# Patient Record
Sex: Male | Born: 1990 | Race: White | Hispanic: No | Marital: Single | Smoking: Never smoker
Health system: Southern US, Community
[De-identification: ages and names within clinical notes are randomized; demographics above are authoritative.]

## PROBLEM LIST (undated history)

## (undated) DIAGNOSIS — I10 Essential (primary) hypertension: Secondary | ICD-10-CM

## (undated) DIAGNOSIS — F319 Bipolar disorder, unspecified: Secondary | ICD-10-CM

## (undated) DIAGNOSIS — E079 Disorder of thyroid, unspecified: Secondary | ICD-10-CM

## (undated) DIAGNOSIS — F909 Attention-deficit hyperactivity disorder, unspecified type: Secondary | ICD-10-CM

## (undated) DIAGNOSIS — F419 Anxiety disorder, unspecified: Secondary | ICD-10-CM

## (undated) HISTORY — DX: Anxiety disorder, unspecified: F41.9

## (undated) HISTORY — PX: FINGER SURGERY: SHX640

---
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
## 2010-05-26 IMAGING — US US EXTREM LOW VENOUS*L*
1 series · 18 of 24 positions shown · non-contrast
Comparison: none

REASON FOR EXAM: CALLREPORT  [phone] weakness swelling calf pain eval DVT
COMMENTS:

PROCEDURE:     US  - US DOPPLER LOW EXTR LEFT  - March 08, 2009 [DATE]
RESULT:     The left femoral and popliteal veins are normally compressible.
The waveform patterns are normal and the color flow images are normal.

[Series 1: us extrem low venous*left* · 18 of 26 slices shown]
[im 1/26]
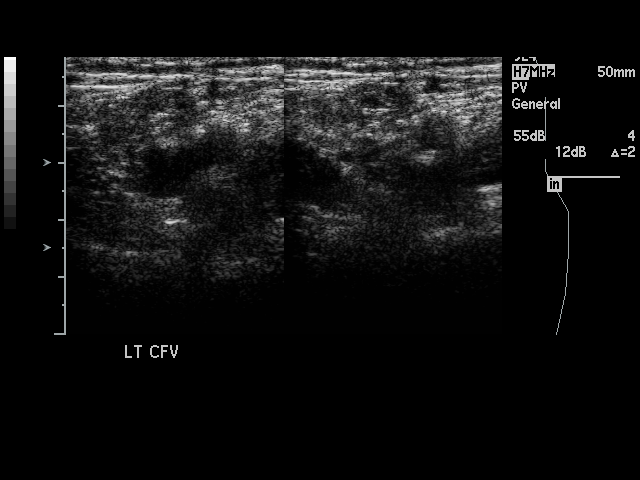
[im 3/26]
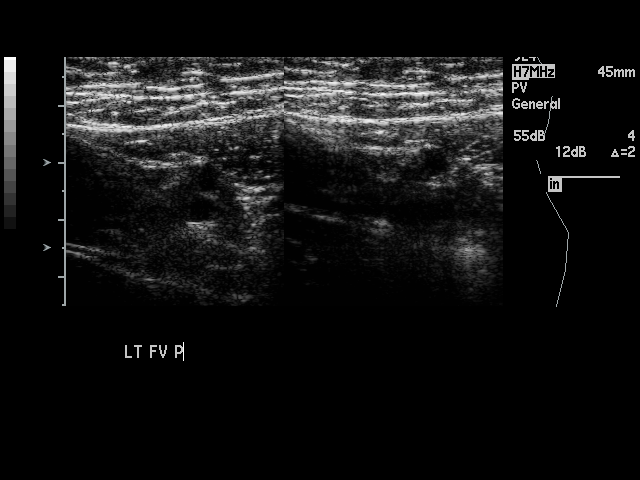
[im 4/26]
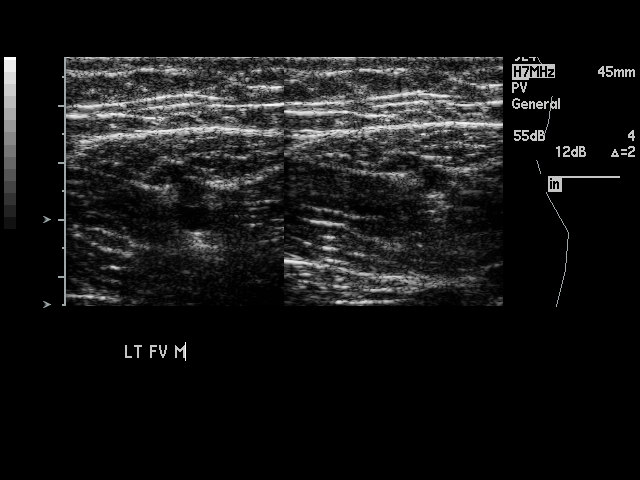
[im 5/26]
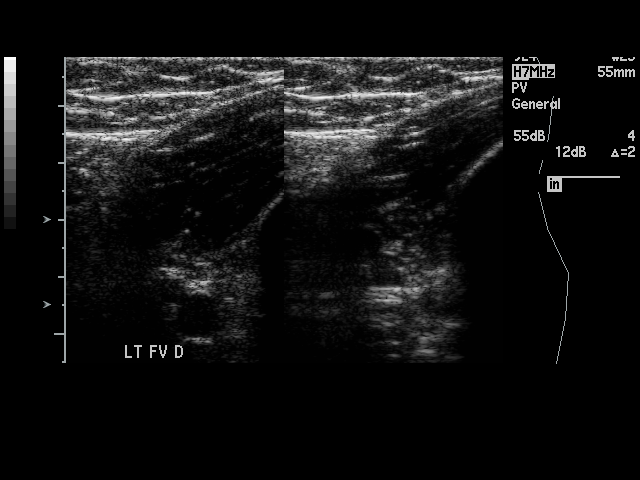
[im 7/26]
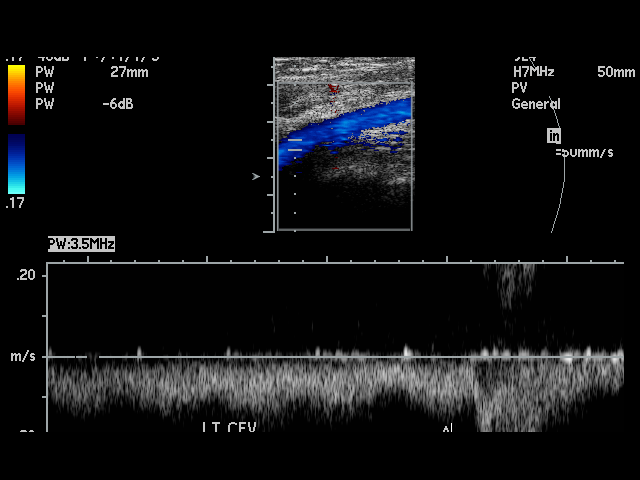
[im 8/26]
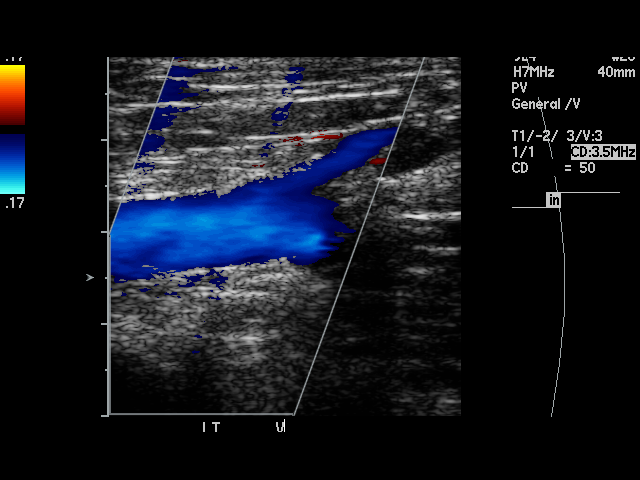
[im 9/26]
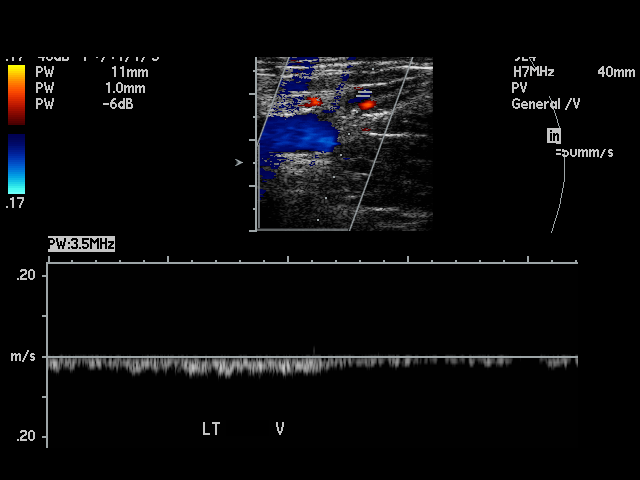
[im 11/26]
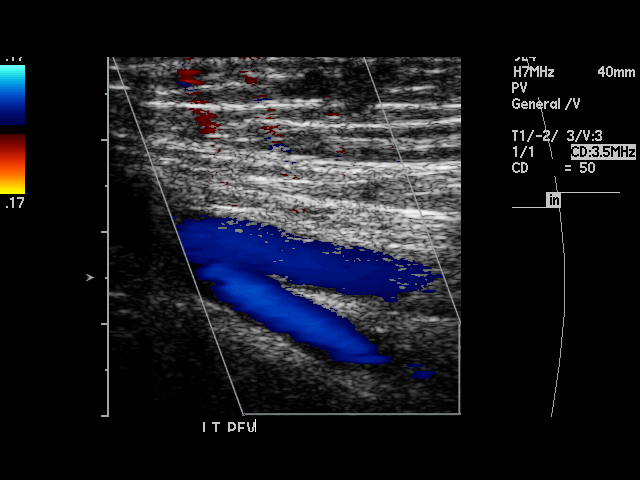
[im 12/26]
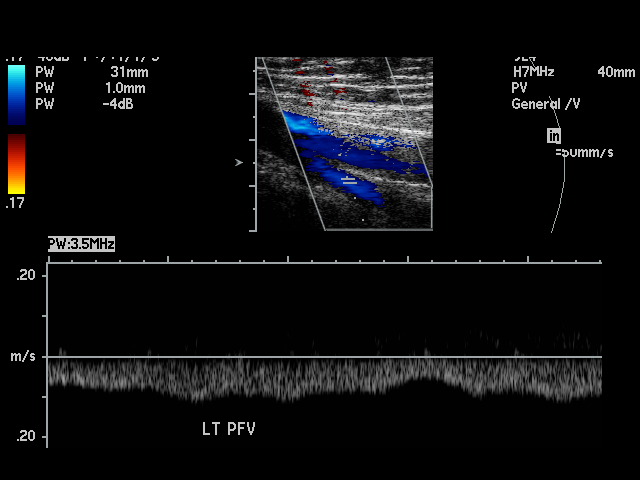
[im 14/26]
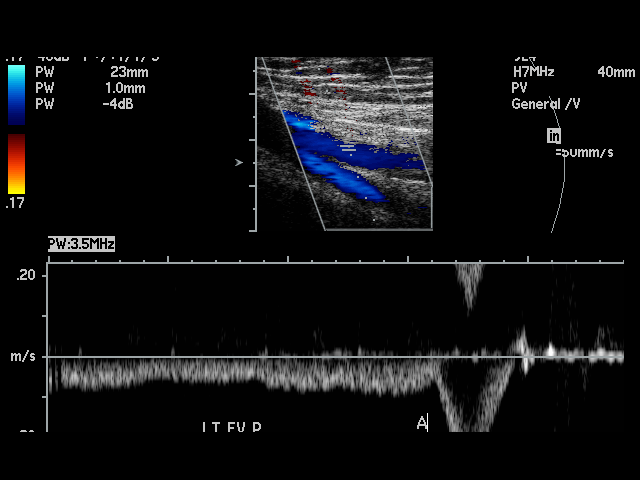
[im 16/26]
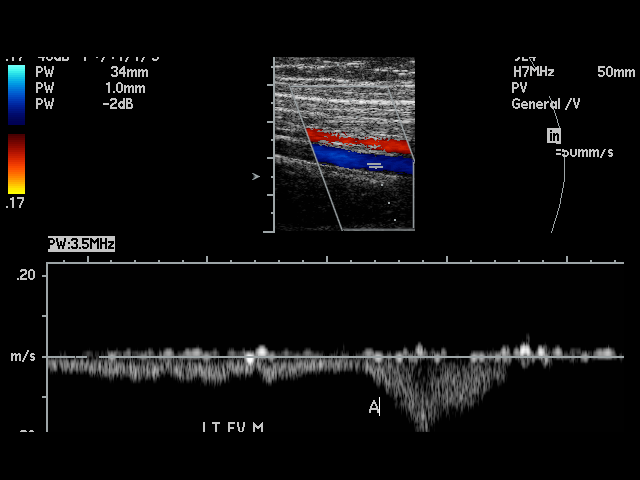
[im 17/26]
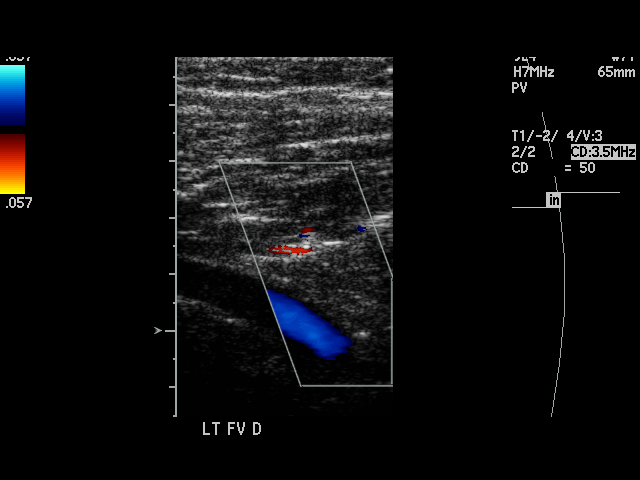
[im 18/26]
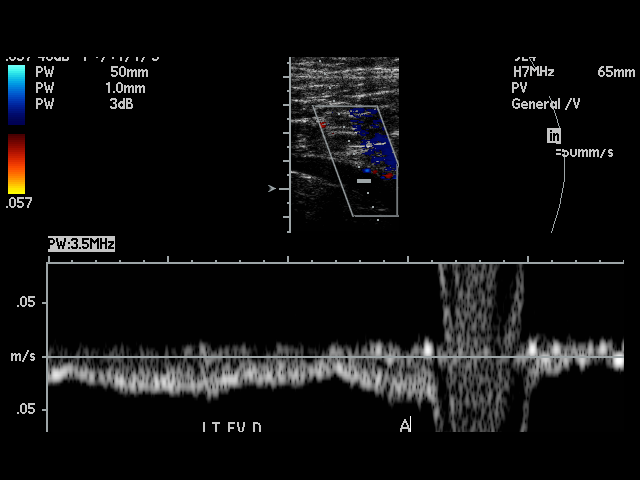
[im 20/26]
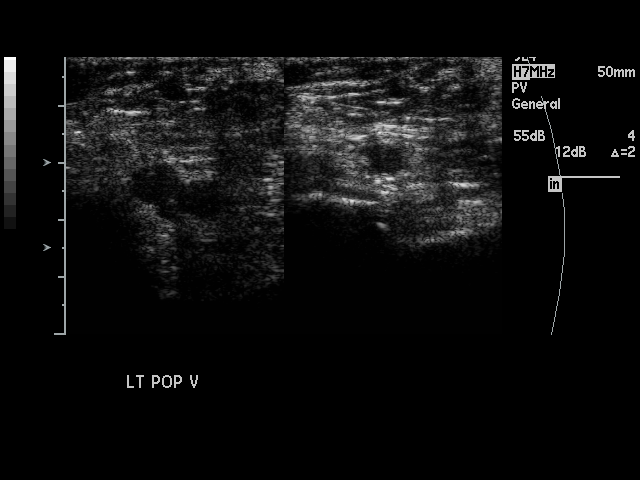
[im 21/26]
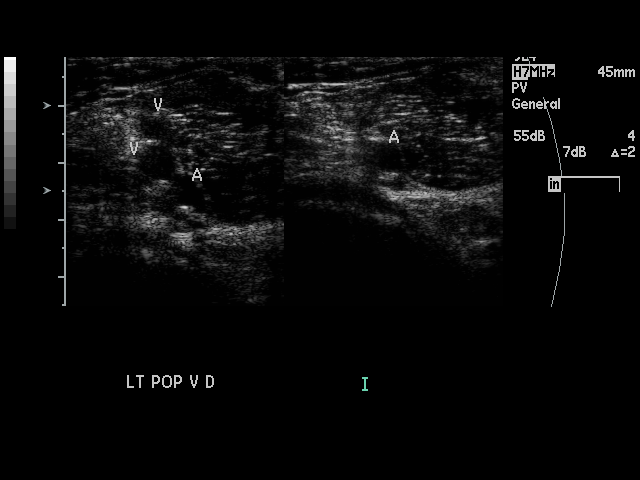
[im 22/26]
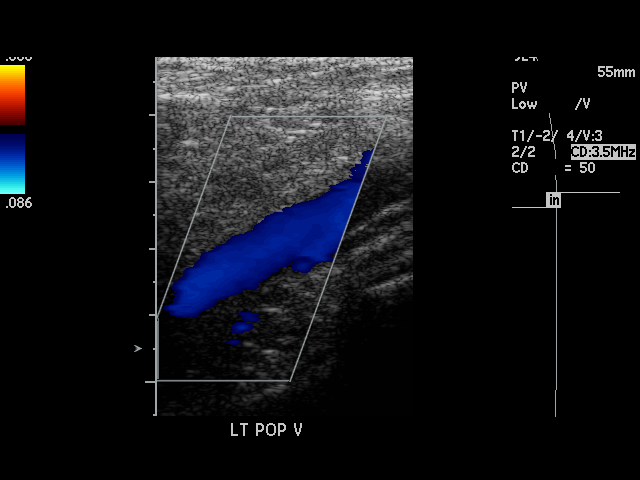
[im 24/26]
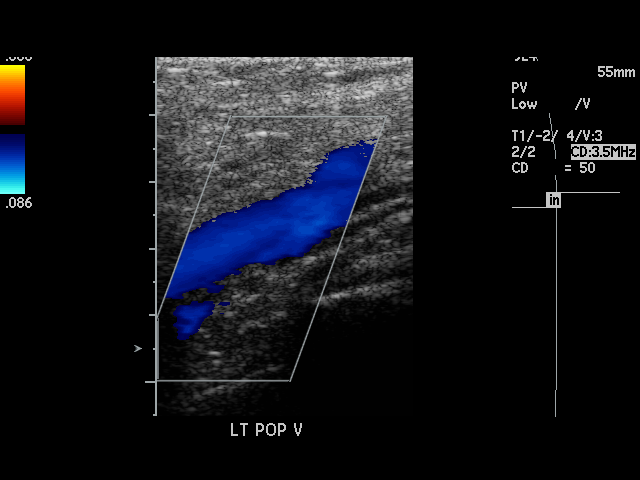
[im 26/26]
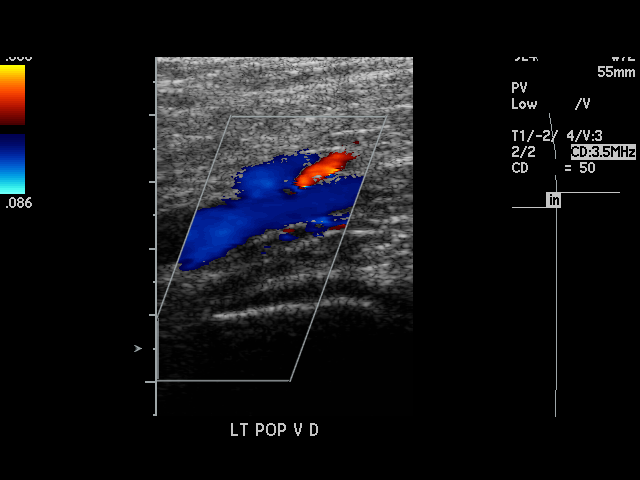

[18 of 24 positions shown; findings below may reference images not displayed]

IMPRESSION: I see no evidence of thrombus within the left femoral or
popliteal veins.

## 2011-11-27 ENCOUNTER — Emergency Department: Payer: Self-pay | Admitting: Unknown Physician Specialty

## 2012-05-05 ENCOUNTER — Emergency Department: Payer: Self-pay | Admitting: Emergency Medicine

## 2013-09-03 ENCOUNTER — Emergency Department: Payer: Self-pay | Admitting: Emergency Medicine

## 2013-09-03 LAB — URINALYSIS, COMPLETE
BACTERIA: NONE SEEN
Bilirubin,UR: NEGATIVE
Blood: NEGATIVE
Glucose,UR: NEGATIVE mg/dL (ref 0–75)
Ketone: NEGATIVE
Leukocyte Esterase: NEGATIVE
Nitrite: NEGATIVE
PH: 6 (ref 4.5–8.0)
Protein: NEGATIVE
RBC,UR: 1 /HPF (ref 0–5)
SPECIFIC GRAVITY: 1.025 (ref 1.003–1.030)
SQUAMOUS EPITHELIAL: NONE SEEN

## 2013-09-03 LAB — COMPREHENSIVE METABOLIC PANEL
ALK PHOS: 84 U/L
Albumin: 4.4 g/dL (ref 3.4–5.0)
Anion Gap: 0 — ABNORMAL LOW (ref 7–16)
BUN: 18 mg/dL (ref 7–18)
Bilirubin,Total: 1.1 mg/dL — ABNORMAL HIGH (ref 0.2–1.0)
CALCIUM: 9.1 mg/dL (ref 8.5–10.1)
CHLORIDE: 103 mmol/L (ref 98–107)
Co2: 31 mmol/L (ref 21–32)
Creatinine: 0.94 mg/dL (ref 0.60–1.30)
EGFR (African American): >60
EGFR (Non-African Amer.): >60
GLUCOSE: 107 mg/dL — AB (ref 65–99)
OSMOLALITY: 271 (ref 275–301)
Potassium: 4.1 mmol/L (ref 3.5–5.1)
SGOT(AST): 21 U/L (ref 15–37)
SGPT (ALT): 19 U/L (ref 12–78)
SODIUM: 134 mmol/L — AB (ref 136–145)
TOTAL PROTEIN: 7.3 g/dL (ref 6.4–8.2)

## 2013-09-03 LAB — CBC WITH DIFFERENTIAL/PLATELET
BASOS ABS: 0 10*3/uL (ref 0.0–0.1)
Basophil %: 0.3 %
Eosinophil #: 0 10*3/uL (ref 0.0–0.7)
Eosinophil %: 0.2 %
HCT: 44.3 % (ref 40.0–52.0)
HGB: 15.1 g/dL (ref 13.0–18.0)
LYMPHS PCT: 4.3 %
Lymphocyte #: 0.3 10*3/uL — ABNORMAL LOW (ref 1.0–3.6)
MCH: 30.2 pg (ref 26.0–34.0)
MCHC: 34.1 g/dL (ref 32.0–36.0)
MCV: 89 fL (ref 80–100)
MONO ABS: 0.5 x10 3/mm (ref 0.2–1.0)
Monocyte %: 7 %
NEUTROS PCT: 88.2 %
Neutrophil #: 6.4 10*3/uL (ref 1.4–6.5)
Platelet: 169 10*3/uL (ref 150–440)
RBC: 5 10*6/uL (ref 4.40–5.90)
RDW: 13.5 % (ref 11.5–14.5)
WBC: 7.2 10*3/uL (ref 3.8–10.6)

## 2013-10-01 LAB — CBC
HCT: 45.5 % (ref 40.0–52.0)
HGB: 15.2 g/dL (ref 13.0–18.0)
MCH: 29.2 pg (ref 26.0–34.0)
MCHC: 33.3 g/dL (ref 32.0–36.0)
MCV: 88 fL (ref 80–100)
PLATELETS: 211 10*3/uL (ref 150–440)
RBC: 5.2 10*6/uL (ref 4.40–5.90)
RDW: 13.1 % (ref 11.5–14.5)
WBC: 4.7 10*3/uL (ref 3.8–10.6)

## 2013-10-01 LAB — DRUG SCREEN, URINE
Amphetamines, Ur Screen: NEGATIVE (ref ?–1000)
BENZODIAZEPINE, UR SCRN: NEGATIVE (ref ?–200)
Barbiturates, Ur Screen: NEGATIVE (ref ?–200)
COCAINE METABOLITE, UR ~~LOC~~: NEGATIVE (ref ?–300)
Cannabinoid 50 Ng, Ur ~~LOC~~: NEGATIVE (ref ?–50)
MDMA (ECSTASY) UR SCREEN: NEGATIVE (ref ?–500)
Methadone, Ur Screen: NEGATIVE (ref ?–300)
Opiate, Ur Screen: NEGATIVE (ref ?–300)
Phencyclidine (PCP) Ur S: NEGATIVE (ref ?–25)
TRICYCLIC, UR SCREEN: NEGATIVE (ref ?–1000)

## 2013-10-01 LAB — COMPREHENSIVE METABOLIC PANEL
ALK PHOS: 100 U/L
ALT: 27 U/L (ref 12–78)
AST: 27 U/L (ref 15–37)
Albumin: 4.5 g/dL (ref 3.4–5.0)
Anion Gap: 3 — ABNORMAL LOW (ref 7–16)
BUN: 9 mg/dL (ref 7–18)
Bilirubin,Total: 0.3 mg/dL (ref 0.2–1.0)
CALCIUM: 9.1 mg/dL (ref 8.5–10.1)
CO2: 29 mmol/L (ref 21–32)
Chloride: 107 mmol/L (ref 98–107)
Creatinine: 0.72 mg/dL (ref 0.60–1.30)
EGFR (African American): >60
Glucose: 128 mg/dL — ABNORMAL HIGH (ref 65–99)
Osmolality: 278 (ref 275–301)
POTASSIUM: 3.7 mmol/L (ref 3.5–5.1)
Sodium: 139 mmol/L (ref 136–145)
Total Protein: 7.7 g/dL (ref 6.4–8.2)

## 2013-10-01 LAB — URINALYSIS, COMPLETE
BACTERIA: NONE SEEN
BLOOD: NEGATIVE
Bilirubin,UR: NEGATIVE
Glucose,UR: NEGATIVE mg/dL (ref 0–75)
KETONE: NEGATIVE
Leukocyte Esterase: NEGATIVE
Nitrite: NEGATIVE
PROTEIN: NEGATIVE
Ph: 6 (ref 4.5–8.0)
RBC,UR: NONE SEEN /HPF (ref 0–5)
SPECIFIC GRAVITY: 1.015 (ref 1.003–1.030)
Squamous Epithelial: NONE SEEN
WBC UR: NONE SEEN /HPF (ref 0–5)

## 2013-10-01 LAB — ETHANOL
Ethanol %: <0.003 % (ref 0.000–0.080)
Ethanol: <3 mg/dL

## 2013-10-01 LAB — ACETAMINOPHEN LEVEL: Acetaminophen: <2 ug/mL

## 2013-10-01 LAB — SALICYLATE LEVEL

## 2013-10-02 ENCOUNTER — Inpatient Hospital Stay: Payer: Self-pay | Admitting: Psychiatry

## 2013-10-05 LAB — LITHIUM LEVEL: Lithium: 1.17 mmol/L

## 2013-10-20 ENCOUNTER — Emergency Department: Payer: Self-pay | Admitting: Emergency Medicine

## 2014-02-04 LAB — ACETAMINOPHEN LEVEL: Acetaminophen: <2 ug/mL

## 2014-02-04 LAB — COMPREHENSIVE METABOLIC PANEL
ALBUMIN: 4.2 g/dL (ref 3.4–5.0)
ALK PHOS: 76 U/L
ALT: 15 U/L
ANION GAP: 8 (ref 7–16)
AST: 23 U/L (ref 15–37)
BILIRUBIN TOTAL: 0.4 mg/dL (ref 0.2–1.0)
BUN: 9 mg/dL (ref 7–18)
CALCIUM: 8.9 mg/dL (ref 8.5–10.1)
CO2: 27 mmol/L (ref 21–32)
Chloride: 106 mmol/L (ref 98–107)
Creatinine: 0.85 mg/dL (ref 0.60–1.30)
EGFR (African American): >60
EGFR (Non-African Amer.): >60
Glucose: 95 mg/dL (ref 65–99)
Osmolality: 280 (ref 275–301)
POTASSIUM: 4 mmol/L (ref 3.5–5.1)
Sodium: 141 mmol/L (ref 136–145)
Total Protein: 7.1 g/dL (ref 6.4–8.2)

## 2014-02-04 LAB — CBC
HCT: 46.3 % (ref 40.0–52.0)
HGB: 15.8 g/dL (ref 13.0–18.0)
MCH: 29.7 pg (ref 26.0–34.0)
MCHC: 34 g/dL (ref 32.0–36.0)
MCV: 87 fL (ref 80–100)
Platelet: 202 10*3/uL (ref 150–440)
RBC: 5.32 10*6/uL (ref 4.40–5.90)
RDW: 12.7 % (ref 11.5–14.5)
WBC: 6 10*3/uL (ref 3.8–10.6)

## 2014-02-04 LAB — SALICYLATE LEVEL: Salicylates, Serum: <1.7 mg/dL

## 2014-02-04 LAB — ETHANOL: Ethanol %: <0.003 % (ref 0.000–0.080)

## 2014-02-05 ENCOUNTER — Inpatient Hospital Stay: Payer: Self-pay | Admitting: Psychiatry

## 2014-02-05 LAB — DRUG SCREEN, URINE

## 2014-02-05 LAB — URINALYSIS, COMPLETE
Bacteria: NONE SEEN
Bilirubin,UR: NEGATIVE
Blood: NEGATIVE
Glucose,UR: NEGATIVE mg/dL (ref 0–75)
Ketone: NEGATIVE
LEUKOCYTE ESTERASE: NEGATIVE
NITRITE: NEGATIVE
PROTEIN: NEGATIVE
Ph: 7 (ref 4.5–8.0)
RBC,UR: <1 /HPF (ref 0–5)
SPECIFIC GRAVITY: 1.009 (ref 1.003–1.030)
Squamous Epithelial: <1
WBC UR: 1 /HPF (ref 0–5)

## 2014-02-05 LAB — LITHIUM LEVEL: Lithium: 0.58 mmol/L — ABNORMAL LOW
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
## 2014-10-09 NOTE — Discharge Summary (Signed)
PATIENT NAME:  Samuel Zavala MR#:  [id number] DATE OF BIRTH:  Sep 13, 1990  DATE OF ADMISSION:  10/02/2013 DATE OF DISCHARGE:  10/06/2013  HOSPITAL COURSE: See dictated history and physical for details of admission. This 24 year old man with a history of bipolar disorder came to the hospital seeking treatment because he said that he had been feeling worse. He had been having suicidal thoughts. His thoughts had felt like they were racing and his mood had been depressed and anxious. He had suffered loss as a grandmother he was close to had recently died. He also had chronic stress from currently living on his own in a boarding house. The patient was jittery and anxious. He was cooperative with admission to the hospital and treatment. In the hospital, we initially had some difference of understanding about what his recent medication had been. The patient had told me that he had been prescribed Abilify, but the Emergency Room psychiatry workers contacted his providers and were clearly informed that his most recent medicine had been Seroquel. Based on this, I had continued him on Seroquel. The patient still insists that he had been on Abilify recently, but after a day of experiencing the Seroquel said that he was willing to try a change to it. We have now titrated the Seroquel up to 400 mg a day and the patient says that he finds this to be helping him to calm down and sleep better as well. He is not currently reporting any hallucinations, does not appear to be paranoid and does not report anything that sounds delusional. He had a lithium level that was slightly elevated at 1.17. Although he was not having specific lithium side effects, I suggested that we cut his dose down slightly,  just to decrease the risk of toxicity and he was agreeable to that. Lithium has now been decreased to 600 mg twice a day. Otherwise, he has continued his prior medications including his levothyroxine and his Cymbalta. He has  participated in groups very appropriately. Not engaged in any dangerous behavior. I did make a trial of treating him with Adderall during this hospitalization. His presentation to me suggested the possibility that attention deficit hyperactivity disorder could be the correct diagnosis, particularly since he told me he had been diagnosed with that as a child. I tried dosing him with a fairly low dose of Adderall 10 mg plain dosage once in the morning and once at noon. He found this to be terribly over stimulating. It made him very jittery, his speech was rapid, his thoughts were worse. We decrease it the next day to 5 mg twice a day instead and he still found this to be over stimulating. He did not become dangerous or psychotic, but we did stop the Adderall. I have counseled him that in the future if the idea of stimulants comes up he should tell any providers that he has had this experience, so it might not be a good idea to try them again. Currently, he is feeling good. He does not appear to be manic. Appears to be safe and agrees to outpatient treatment going back to [place].   DISCHARGE MEDICATIONS: Seroquel XR 400 mg p.o. at bedtime, levothyroxine 25 mcg p.o. daily, Cymbalta 60 mg p.o. daily and lithium 600 mg twice a day.   MENTAL STATUS AT DISCHARGE: Casually dressed, neatly groomed man who looks his stated age, cooperative with the interview. Good eye contact. Normal psychomotor activity. Speech normal rate, tone and volume. Affect euthymic, reactive, appropriate.  Mood stated as good. Thoughts appear to be lucid. No sign of delusions. No loosening of associations. Denies auditory or visual hallucinations. Denies suicidal or homicidal ideation. Alert and oriented x 4. Normal fund of knowledge. Short and long-term memory intact.   LABORATORY TESTING: Admission labs included a negative drug screen. A chemistry panel with an elevated glucose at 128 that was a non-fasting draw. Alcohol level negative. CBC  normal. Urinalysis unremarkable. The lithium level was checked on the 20th and was 1.17. I did not recheck after lowering the dose.   DISPOSITION: Discharge home. He lives in a boarding house.   FOLLOWUP With [facility] [place] and Dr. Elesa Massed.   DIAGNOSIS, PRINCIPAL AND PRIMARY:  AXIS I: Bipolar disorder, type I mixed episode.  SECONDARY DIAGNOSES:  AXIS I: Attention deficit/hyperactivity disorder by past history.  AXIS II: Deferred.  AXIS III: Hypothyroidism.  AXIS IV: Moderate from recent death in the family and chronic isolation.  AXIS V: Functioning at time of discharge 60.  ____________________________ Audery Amel, MD jtc:aw D: 10/06/2013 09:48:00 ET T: 10/06/2013 10:01:04 ET JOB#: [id number]  cc: Audery Amel, MD, <Dictator> Audery Amel MD ELECTRONICALLY SIGNED 10/06/2013 11:43

## 2015-01-07 IMAGING — CR DG ANKLE COMPLETE 3+V*L*
1 series · 3 of 3 positions shown · non-contrast
Comparison: None.

CLINICAL DATA: Jumped from tree, lateral ankle swelling.

EXAM:
LEFT ANKLE COMPLETE - 3+ VIEW

[Series 1: ap · 0.17mm/px · 3 of 3 slices shown]
[im 1/3]
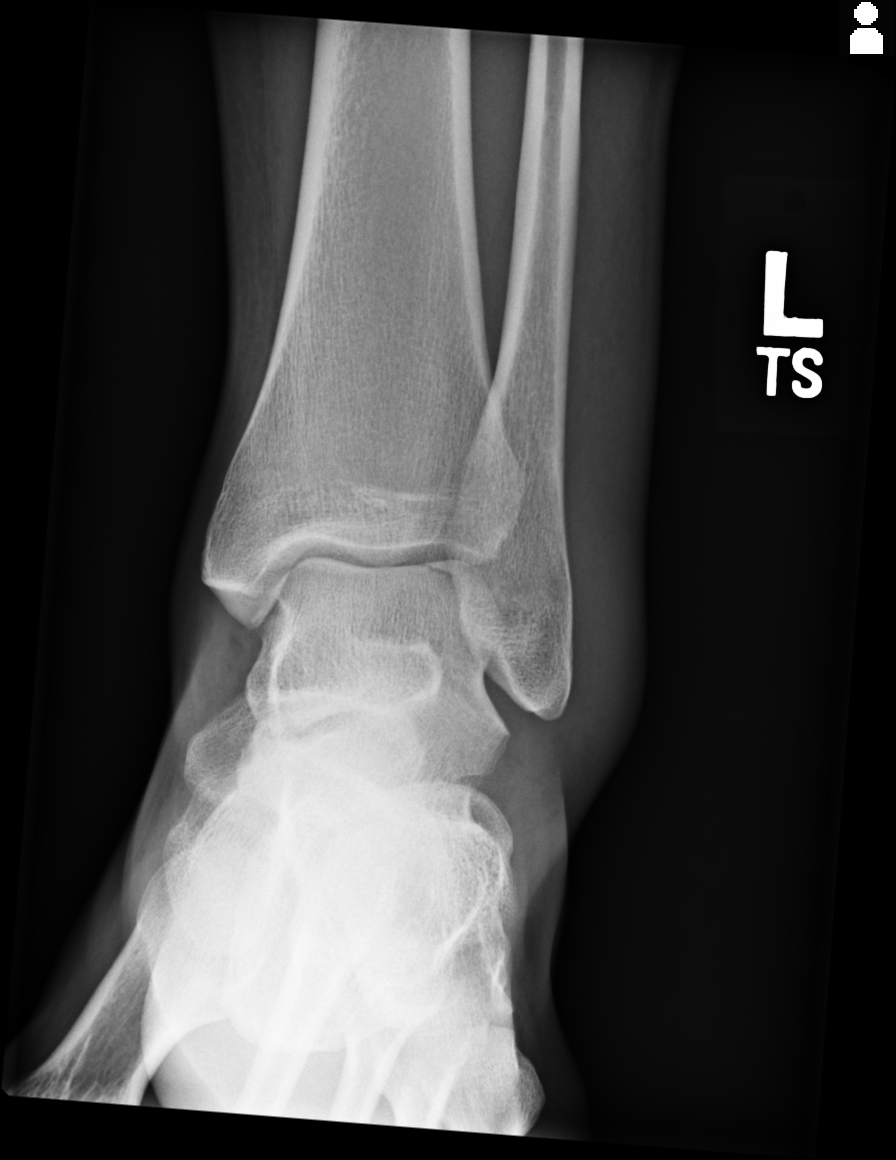
[im 2/3]
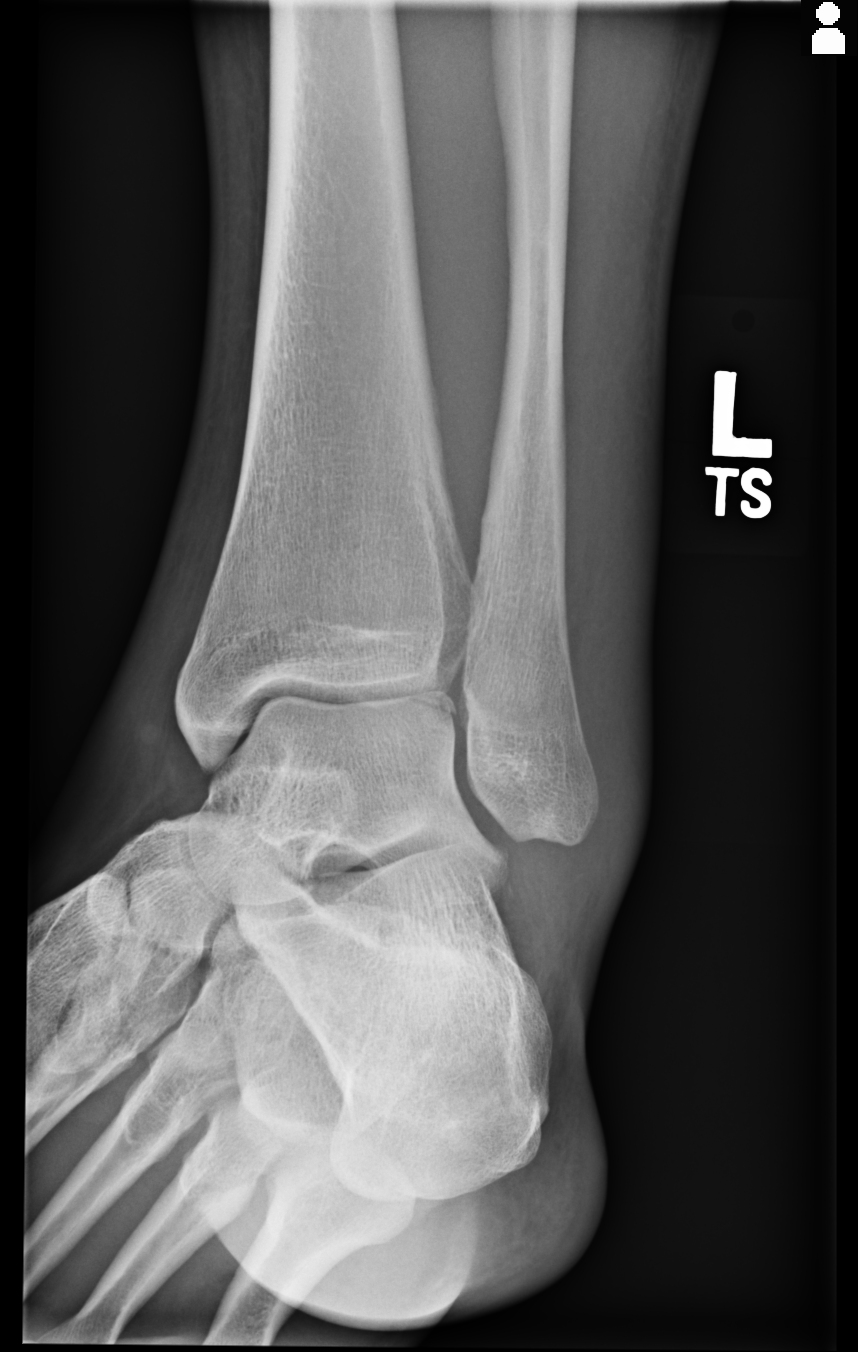
[im 3/3]
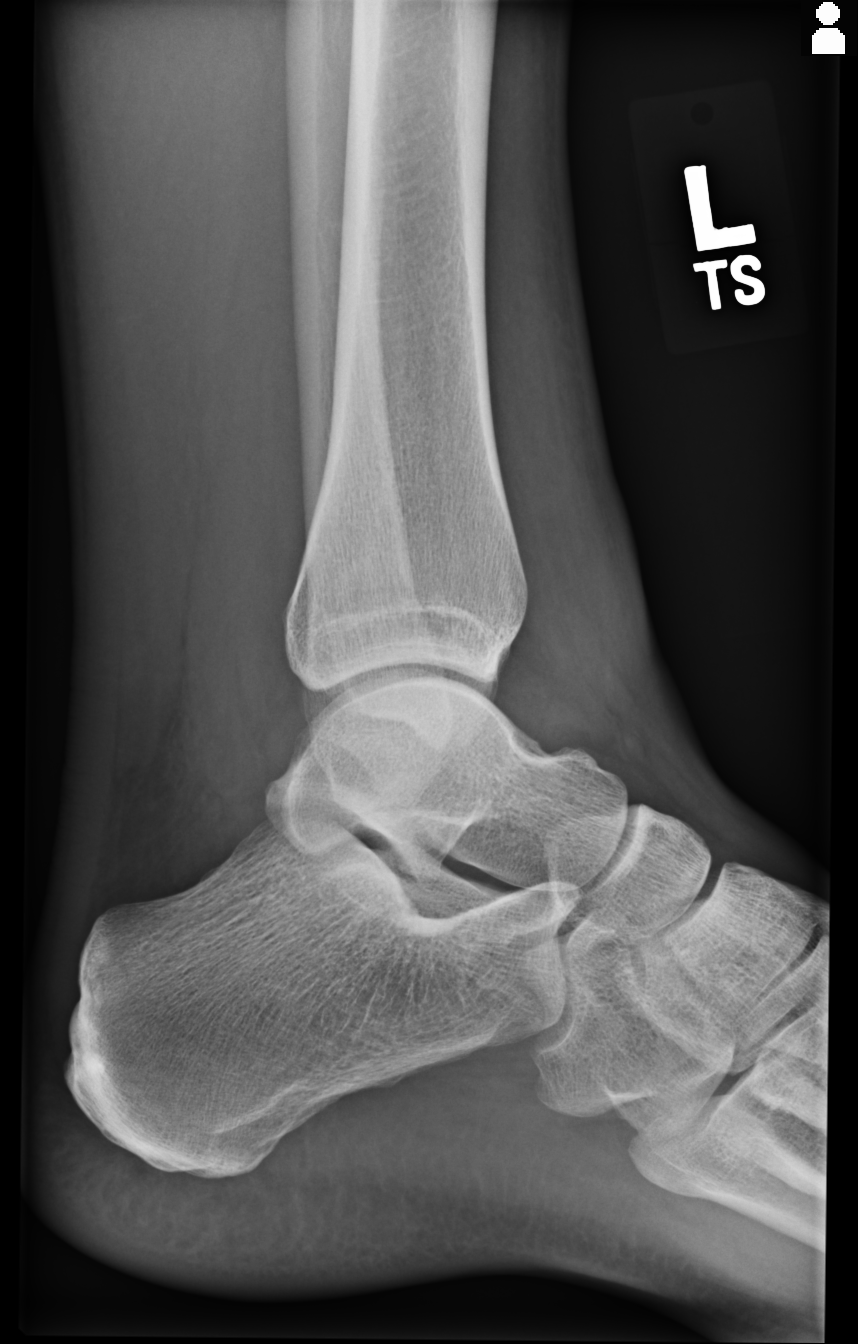

[3 of 3 positions shown; findings below may reference images not displayed]

FINDINGS: Nondisplaced intra-articular lateral talar dome fracture, mid seen
on the oblique view. No dislocation. The ankle mortise appears
congruent and the tibiofibular syndesmosis intact. No destructive
bony lesions. Lateral ankle soft tissue swelling without
subcutaneous gas nor radiopaque foreign bodies.
IMPRESSION: Nondisplaced intra-articular lateral talar dome fracture without
dislocation.

  By: Olimpia Tiger

## 2015-08-10 ENCOUNTER — Ambulatory Visit
Admission: EM | Admit: 2015-08-10 | Discharge: 2015-08-10 | Disposition: A | Discharge status: Home / Self Care | Payer: Self-pay | Attending: Family Medicine | Admitting: Family Medicine

## 2015-08-10 ENCOUNTER — Encounter: Payer: Self-pay | Admitting: Emergency Medicine

## 2015-08-10 DIAGNOSIS — G44209 Tension-type headache, unspecified, not intractable: Secondary | ICD-10-CM

## 2015-08-10 HISTORY — DX: Bipolar disorder, unspecified: F31.9

## 2015-08-10 HISTORY — DX: Attention-deficit hyperactivity disorder, unspecified type: F90.9

## 2015-08-10 MED ORDER — ONDANSETRON 8 MG PO TBDP: 8.0000 mg | ORAL_TABLET | Freq: Two times a day (BID) | ORAL | Status: DC

## 2015-08-10 NOTE — ED Provider Notes (Signed)
CSN: [id number]     Arrival date & time 08/10/15  2007 History   First MD Initiated Contact with Patient 08/10/15 2106     Chief Complaint  Patient presents with  . Headache   (Consider location/radiation/quality/duration/timing/severity/associated sxs/prior Treatment) Patient is a 25 y.o. male presenting with headaches. The history is provided by the patient.  Headache Pain location:  Generalized Quality:  Dull Radiates to:  Does not radiate Severity currently:  2/10 Onset quality:  Sudden Duration:  3 hours Timing:  Constant Progression:  Improving Chronicity:  New Similar to prior headaches: yes   Context: emotional stress   Relieved by:  None tried Ineffective treatments:  None tried Associated symptoms: nausea   Associated symptoms: no abdominal pain, no back pain, no blurred vision, no congestion, no cough, no diarrhea, no dizziness, no drainage, no ear pain, no eye pain, no facial pain, no fatigue, no fever, no focal weakness, no loss of balance, no myalgias, no near-syncope, no neck pain, no neck stiffness, no numbness, no paresthesias, no photophobia, no seizures, no sinus pressure, no sore throat, no swollen glands, no syncope, no tingling, no URI, no visual change, no vomiting and no weakness     Past Medical History  Diagnosis Date  . Bipolar 1 disorder (HCC)   . ADHD (attention deficit hyperactivity disorder)    History reviewed. No pertinent past surgical history. History reviewed. No pertinent family history. Social History  Substance Use Topics  . Smoking status: Never Smoker   . Smokeless tobacco: Never Used  . Alcohol Use: No    Review of Systems  Constitutional: Negative for fever and fatigue.  HENT: Negative for congestion, ear pain, postnasal drip, sinus pressure and sore throat.   Eyes: Negative for blurred vision, photophobia and pain.  Respiratory: Negative for cough.   Cardiovascular: Negative for syncope and near-syncope.  Gastrointestinal:  Positive for nausea. Negative for vomiting, abdominal pain and diarrhea.  Musculoskeletal: Negative for myalgias, back pain, neck pain and neck stiffness.  Neurological: Positive for headaches. Negative for dizziness, focal weakness, seizures, weakness, numbness, paresthesias and loss of balance.    Allergies  Review of patient's allergies indicates no known allergies.  Home Medications   Prior to Admission medications   Medication Sig Start Date End Date Taking? Authorizing Provider  levothyroxine (SYNTHROID, LEVOTHROID) 25 MCG tablet Take 25 mcg by mouth daily before breakfast.   Yes Historical Provider, MD  mirtazapine (REMERON) 45 MG tablet Take 45 mg by mouth at bedtime.   Yes Historical Provider, MD  risperiDONE (RISPERDAL) 1 MG tablet Take 1 mg by mouth at bedtime.   Yes Historical Provider, MD  ondansetron (ZOFRAN ODT) 8 MG disintegrating tablet Take 1 tablet (8 mg total) by mouth 2 (two) times daily. As needed for nausea 08/10/15   Payton Mccallum, MD   Meds Ordered and Administered this Visit  Medications - No data to display  BP 123/83 mmHg  Pulse 76  Temp(Src) 97.7 F (36.5 C) (Tympanic)  Resp 16  Ht  (1.803 m)  Wt 175 lb (79.379 kg)  BMI 24.42 kg/m2  SpO2 99% No data found.   Physical Exam  Constitutional: He is oriented to person, place, and time. He appears well-developed and well-nourished. No distress.  HENT:  Head: Normocephalic and atraumatic.  Right Ear: Tympanic membrane, external ear and ear canal normal.  Left Ear: Tympanic membrane, external ear and ear canal normal.  Nose: Nose normal.  Mouth/Throat: Uvula is midline, oropharynx is clear and  moist and mucous membranes are normal. No oropharyngeal exudate or tonsillar abscesses.  Eyes: Conjunctivae and EOM are normal. Pupils are equal, round, and reactive to light. Right eye exhibits no discharge. Left eye exhibits no discharge. No scleral icterus.  Neck: Normal range of motion. Neck supple. No  tracheal deviation present. No thyromegaly present.  Cardiovascular: Normal rate, regular rhythm and normal heart sounds.   Pulmonary/Chest: Effort normal and breath sounds normal. No stridor. No respiratory distress. He has no wheezes. He has no rales. He exhibits no tenderness.  Lymphadenopathy:    He has no cervical adenopathy.  Neurological: He is alert and oriented to person, place, and time. He has normal reflexes. He displays normal reflexes. No cranial nerve deficit. He exhibits normal muscle tone. Coordination normal.  Skin: Skin is warm and dry. No rash noted. He is not diaphoretic.  Nursing note and vitals reviewed.   ED Course  Procedures (including critical care time)  Labs Review Labs Reviewed - No data to display  Imaging Review No results found.   Visual Acuity Review  Right Eye Distance:   Left Eye Distance:   Bilateral Distance:    Right Eye Near:   Left Eye Near:    Bilateral Near:         MDM   1. Tension headache    Discharge Medication List as of 08/10/2015  9:26 PM    START taking these medications   Details  ondansetron (ZOFRAN ODT) 8 MG disintegrating tablet Take 1 tablet (8 mg total) by mouth 2 (two) times daily. As needed for nausea, Starting 08/10/2015, Until Discontinued, Print       1. diagnosis reviewed with patient 2. rx as per orders above; reviewed possible side effects, interactions, risks and benefits  3. Recommend supportive treatment with otc analgesics 4. Follow-up prn if symptoms worsen or don't improve    Payton Mccallum, MD 08/10/15 2152

## 2015-08-10 NOTE — ED Notes (Signed)
Patient reports nausea and headache that started around 6:30pm today.  Patient denies fevers.  Patient denies V/D.

## 2016-05-06 ENCOUNTER — Emergency Department
Admission: EM | Admit: 2016-05-06 | Discharge: 2016-05-06 | Disposition: A | Discharge status: Home / Self Care | Payer: Self-pay | Attending: Emergency Medicine | Admitting: Emergency Medicine

## 2016-05-06 DIAGNOSIS — K047 Periapical abscess without sinus: Secondary | ICD-10-CM | POA: Insufficient documentation

## 2016-05-06 DIAGNOSIS — F909 Attention-deficit hyperactivity disorder, unspecified type: Secondary | ICD-10-CM | POA: Insufficient documentation

## 2016-05-06 DIAGNOSIS — Z79899 Other long term (current) drug therapy: Secondary | ICD-10-CM | POA: Insufficient documentation

## 2016-05-06 MED ORDER — AMOXICILLIN 500 MG PO CAPS
ORAL_CAPSULE | ORAL | Status: AC
  Administered 2016-05-06: 500 mg via ORAL
  Filled 2016-05-06: qty 1

## 2016-05-06 MED ORDER — AMOXICILLIN 875 MG PO TABS: 875.0000 mg | ORAL_TABLET | Freq: Two times a day (BID) | ORAL | 0 refills | Status: AC

## 2016-05-06 MED ORDER — MELOXICAM 7.5 MG PO TABS
ORAL_TABLET | ORAL | Status: AC
  Administered 2016-05-06: 7.5 mg via ORAL
  Filled 2016-05-06: qty 1

## 2016-05-06 MED ORDER — MELOXICAM 7.5 MG PO TABS
7.5000 mg | ORAL_TABLET | ORAL | Status: AC
  Administered 2016-05-06: 7.5 mg via ORAL

## 2016-05-06 MED ORDER — MELOXICAM 7.5 MG PO TABS: 7.5000 mg | ORAL_TABLET | Freq: Every day | ORAL | 2 refills | Status: AC

## 2016-05-06 MED ORDER — AMOXICILLIN 250 MG PO CAPS
ORAL_CAPSULE | ORAL | Status: AC
  Administered 2016-05-06: 250 mg via ORAL
  Filled 2016-05-06: qty 1

## 2016-05-06 MED ORDER — AMOXICILLIN 500 MG PO CAPS
500.0000 mg | ORAL_CAPSULE | ORAL | Status: AC
  Administered 2016-05-06: 500 mg via ORAL

## 2016-05-06 MED ORDER — AMOXICILLIN 250 MG PO CAPS
250.0000 mg | ORAL_CAPSULE | Freq: Once | ORAL | Status: AC
  Administered 2016-05-06: 250 mg via ORAL

## 2016-05-06 NOTE — ED Notes (Signed)
NAD noted at time of D/C. Pt denies questions or concerns. Pt ambulatory to the lobby at this time.  

## 2016-05-06 NOTE — Discharge Instructions (Signed)
Please call and schedule a dental appointment as soon as possible. You will need to be seen within the next 14 days. Return to the emergency department for symptoms that change or worsen if you're unable to schedule an appointment.  OPTIONS FOR DENTAL FOLLOW UP CARE  [place] Department of Health and Human Services - Local Safety Net Dental Clinics http://www.ncdhhs.gov/dph/oralhealth/services/safetynetclinics.htm   [facility] ([phone])  [facility] ([phone])  [facility] ([phone])  [facility] ([phone])  [facility] ([phone]) This clinic caters to the indigent population and is on a lottery system. Location: [facility], [address], [place] Clinic Hours: Wednesdays from 6pm - 9pm, patients seen by a lottery system. For dates, call or go to www.med.[facility].edu/[facility]/patients/Dental-[facility] Services: Cleanings, fillings and simple extractions. Payment Options: DENTAL WORK IS FREE OF CHARGE. Bring proof of income or support. Best way to get seen: Arrive at 5:15 pm - this is a lottery, NOT first come/first serve, so arriving earlier will not increase your chances of being seen.     [facility] [phone] Select option 1 for emergencies   Location: [facility], [address], [place] Clinic Hours: No walk-ins accepted - call the day before to schedule an appointment. Check in times are 9:30 am and 1:30 pm. Services: Simple extractions, temporary fillings, pulpectomy/pulp debridement, uncomplicated abscess drainage. Payment Options: PAYMENT IS DUE AT THE TIME OF SERVICE.  Fee is usually $100-200, additional surgical procedures (e.g. abscess drainage) may be extra. Cash, checks, Visa/MasterCard accepted.  Can file Medicaid if patient is covered for dental - patient should call case worker to check. No  discount for [facility] patients. Best way to get seen: MUST call the day before and get onto the schedule. Can usually be seen the next 1-2 days. No walk-ins accepted.     [facility] [phone]   Location: [facility], [address], [place] Clinic Hours: M, W, Th, F 8am or 1:30pm, Tues 9a or 1:30 - first come/first served. Services: Simple extractions, temporary fillings, uncomplicated abscess drainage.  You do not need to be an [place] resident. Payment Options: PAYMENT IS DUE AT THE TIME OF SERVICE. Dental insurance, otherwise sliding scale - bring proof of income or support. Depending on income and treatment needed, cost is usually $50-200. Best way to get seen: Arrive early as it is first come/first served.     [facility] [phone]   Location: [address] Clinic Hours: Mon-Thu 8a-5p Services: Most basic dental services including extractions and fillings. Payment Options: PAYMENT IS DUE AT THE TIME OF SERVICE. Sliding scale, up to 50% off - bring proof if income or support. Medicaid with dental option accepted. Best way to get seen: Call to schedule an appointment, can usually be seen within 2 weeks OR they will try to see walk-ins - show up at 8a or 2p (you may have to wait).     [facility] [phone] [place] RESIDENTS ONLY   Location: [facility], [address], [place], [place] [address] Clinic Hours: By appointment only. Monday - Thursday 8am-5pm, Friday 8am-12pm Services: Cleanings, fillings, extractions. Payment Options: PAYMENT IS DUE AT THE TIME OF SERVICE. Cash, Visa or MasterCard. Sliding scale - $30 minimum per service. Best way to get seen: Come in to office, complete packet and make an appointment -   need proof of income or support monies for each household member and proof of [place]  residence. Usually takes about a month to get in.     [facility] [phone]   Location: [address] [place] Clinic Hours: Walk-in Urgent Care Dental Services are offered Monday-Friday mornings only. The numbers of emergencies accepted daily is limited to the number of providers available. Maximum 15 - Mondays, Wednesdays & Thursdays Maximum 10 - Tuesdays & Fridays Services: You do not need to be a [place] resident to be seen for a dental emergency. Emergencies are defined as pain, swelling, abnormal bleeding, or dental trauma. Walkins will receive x-rays if needed. NOTE: Dental cleaning is not an emergency. Payment Options: PAYMENT IS DUE AT THE TIME OF SERVICE. Minimum co-pay is $40.00 for uninsured patients. Minimum co-pay is $3.00 for Medicaid with dental coverage. Dental Insurance is accepted and must be presented at time of visit. Medicare does not cover dental. Forms of payment: Cash, credit card, checks. Best way to get seen: If not previously registered with the clinic, walk-in dental registration begins at 7:15 am and is on a first come/first serve basis. If previously registered with the clinic, call to make an appointment.     [facility] [phone] [place] RESIDENTS ONLY   Location: [address], [place], [place] Clinic Hours: Mon-Thu 10a-2p Services: Extractions only! Payment Options: FREE (donations accepted) - bring proof of income or support Best way to get seen: Call and schedule an appointment OR come at 8am on the 1st Monday of every month (except for holidays) when it is first come/first served.     [facility] [phone]   Location: [address], [place] Clinic Hours: Friday mornings Services, Payment Options, Best way to get seen: Call for info  

## 2016-05-06 NOTE — ED Provider Notes (Signed)
[facility] Emergency Department Provider Note ____________________________________________  Time seen: Approximately 7:48 PM  I have reviewed the triage vital signs and the nursing notes.   HISTORY  Chief Complaint Dental Pain   HPI Samuel Zavala is a 25 y.o. male that presents with dental pain for 3 days. Patient states that right side of cheek feels swollen today. Patient has taken Aleve for pain, which has not helped. Patient states that he has frequent problems with his teeth because he does not have the money to go to the dentist. Patient has not seen a dentist in over a year.  Patient denies fever.    Past Medical History:  Diagnosis Date  . ADHD (attention deficit hyperactivity disorder)   . Bipolar 1 disorder (HCC)     There are no active problems to display for this patient.   No past surgical history on file.  Prior to Admission medications   Medication Sig Start Date End Date Taking? Authorizing Provider  amoxicillin (AMOXIL) 875 MG tablet Take 1 tablet (875 mg total) by mouth 2 (two) times daily. 05/06/16 05/16/16  Enid DerryAshley Jorita Bohanon, PA-C  levothyroxine (SYNTHROID, LEVOTHROID) 25 MCG tablet Take 25 mcg by mouth daily before breakfast.    Historical Provider, MD  meloxicam (MOBIC) 7.5 MG tablet Take 1 tablet (7.5 mg total) by mouth daily. 05/06/16 05/16/16  Enid DerryAshley Nimra Puccinelli, PA-C  mirtazapine (REMERON) 45 MG tablet Take 45 mg by mouth at bedtime.    Historical Provider, MD  ondansetron (ZOFRAN ODT) 8 MG disintegrating tablet Take 1 tablet (8 mg total) by mouth 2 (two) times daily. As needed for nausea 08/10/15   Payton Mccallumrlando Conty, MD  risperiDONE (RISPERDAL) 1 MG tablet Take 1 mg by mouth at bedtime.    Historical Provider, MD    Allergies Patient has no known allergies.  No family history on file.  Social History Social History  Substance Use Topics  . Smoking status: Never Smoker  . Smokeless tobacco: Never Used  . Alcohol use No    Review  of Systems Constitutional: No fever, chills. No recent illness.  ENT: Several teeth with decay.  No sore through. No trouble swallowing.  Musculoskeletal: Negative for jaw pain/trismus. Pain over right cheek.  Skin: No rash ____________________________________________   PHYSICAL EXAM:  VITAL SIGNS: ED Triage Vitals  Enc Vitals Group     BP 05/06/16 1901 130/70     Pulse Rate 05/06/16 1901 69     Resp 05/06/16 1901 18     Temp 05/06/16 1901 98.6 F (37 C)     Temp Source 05/06/16 1901 Oral     SpO2 05/06/16 1901 98 %     Weight 05/06/16 1901 180 lb (81.6 kg)     Height 05/06/16 1901 5\' 11"  (1.803 m)     Head Circumference --      Peak Flow --      Pain Score 05/06/16 1905 10     Pain Loc --      Pain Edu? --      Excl. in GC? --     Constitutional: Alert and oriented. Well appearing and in no acute distress. Eyes: Conjunctivae are normal.  Mouth/Throat: Mucous membranes are moist. Oropharynx non-erythematous. Several decaying teeth. One broken tooth in upper right.  Periodontal Exam Hematological/Lymphatic/Immunilogical: No cervical lymphadenopathy. Respiratory: Normal respiratory effort.  Musculoskeletal: Full ROM x 4 extremities. Neurologic:  Normal speech and language. No gross focal neurologic deficits are appreciated. Speech is normal. No gait instability.  Skin:  Swelling over right cheek. No rashes or erythema.  Psychiatric: Mood and affect are normal. Speech and behavior are normal.  ____________________________________________   LABS (all labs ordered are listed, but only abnormal results are displayed)  Labs Reviewed - No data to display ____________________________________________   RADIOLOGY   PROCEDURES   Critical Care performed: No  ____________________________________________   INITIAL IMPRESSION / ASSESSMENT AND PLAN / ED COURSE  Pertinent labs & imaging results that were available during my care of the patient were reviewed by me and  considered in my medical decision making (see chart for details). Patient will be prescribed Amoxicillin. Considered Augmentin or Clindamycin but due to cost, amoxicillin was chosen.   Patient was advised to see the dentist within 14 days. Also advised to take the antibiotic until finished. Instructed to return to the ER for symptoms that change or worsen if unable to schedule an appointment. ____________________________________________   FINAL CLINICAL IMPRESSION(S) / ED DIAGNOSES  Final diagnoses:  Dental abscess    Note:  This document was prepared using Dragon voice recognition software and may include unintentional dictation errors.   Enid DerryAshley Arita Severtson, PA-C 05/06/16 2014    Sharman CheekPhillip Stafford, MD 05/15/16 [phone]

## 2016-05-06 NOTE — ED Triage Notes (Signed)
Pt comes in c/o right sided facial swelling with right upper dental pain. Pt reports that the pain started three days ago but he just noticed the swelling today.

## 2016-11-01 ENCOUNTER — Encounter: Payer: Self-pay | Admitting: Emergency Medicine

## 2016-11-01 ENCOUNTER — Emergency Department
Admission: EM | Admit: 2016-11-01 | Discharge: 2016-11-01 | Disposition: A | Discharge status: Home / Self Care | Payer: Self-pay | Attending: Emergency Medicine | Admitting: Emergency Medicine

## 2016-11-01 DIAGNOSIS — K0889 Other specified disorders of teeth and supporting structures: Secondary | ICD-10-CM | POA: Insufficient documentation

## 2016-11-01 DIAGNOSIS — Z79899 Other long term (current) drug therapy: Secondary | ICD-10-CM | POA: Insufficient documentation

## 2016-11-01 DIAGNOSIS — F909 Attention-deficit hyperactivity disorder, unspecified type: Secondary | ICD-10-CM | POA: Insufficient documentation

## 2016-11-01 MED ORDER — AMOXICILLIN 500 MG PO CAPS: 500.0000 mg | ORAL_CAPSULE | Freq: Three times a day (TID) | ORAL | 0 refills | Status: AC

## 2016-11-01 MED ORDER — NAPROXEN 500 MG PO TBEC: 500.0000 mg | DELAYED_RELEASE_TABLET | Freq: Two times a day (BID) | ORAL | 0 refills | Status: AC

## 2016-11-01 MED ORDER — AMOXICILLIN 500 MG PO CAPS
500.0000 mg | ORAL_CAPSULE | Freq: Once | ORAL | Status: AC
  Administered 2016-11-01: 500 mg via ORAL
  Filled 2016-11-01: qty 1

## 2016-11-01 MED ORDER — NAPROXEN 500 MG PO TABS
500.0000 mg | ORAL_TABLET | Freq: Once | ORAL | Status: AC
  Administered 2016-11-01: 500 mg via ORAL
  Filled 2016-11-01: qty 1

## 2016-11-01 NOTE — ED Provider Notes (Signed)
[name]  ____________________________________________  Time seen: Approximately 11:15 PM  I have reviewed the triage vital signs and the nursing notes.   HISTORY  Chief Complaint Dental Pain    HPI Samuel Zavala is a 26 y.o. male presents to the emergency department with 8/10 acute left upper jaw pain. Patient states that he had fever yesterday. He has a history of dental abscesses and reports needing multiple teeth pulled. Patoemt denies associated chest pain, chest tightness, nausea, vomiting or abdominal pain. No alleviating measures have been attempted.   Past Medical History:  Diagnosis Date  . ADHD (attention deficit hyperactivity disorder)   . Bipolar 1 disorder (HCC)     There are no active problems to display for this patient.   History reviewed. No pertinent surgical history.  Prior to Admission medications   Medication Sig Start Date End Date Taking? Authorizing Provider  amoxicillin (AMOXIL) 500 MG capsule Take 1 capsule (500 mg total) by mouth 3 (three) times daily. 11/01/16 11/11/16  Orvil FeilWoods, Kyng Matlock M, PA-C  levothyroxine (SYNTHROID, LEVOTHROID) 25 MCG tablet Take 25 mcg by mouth daily before breakfast.    [provider]  mirtazapine (REMERON) 45 MG tablet Take 45 mg by mouth at bedtime.    [provider]  naproxen (EC NAPROSYN) 500 MG EC tablet Take 1 tablet (500 mg total) by mouth 2 (two) times daily with a meal. 11/01/16 11/11/16  Pia MauWoods, Valerie Cones M, PA-C  ondansetron (ZOFRAN ODT) 8 MG disintegrating tablet Take 1 tablet (8 mg total) by mouth 2 (two) times daily. As needed for nausea 08/10/15   Payton Mccallumonty, [place], MD  risperiDONE (RISPERDAL) 1 MG tablet Take 1 mg by mouth at bedtime.    [provider]    Allergies Patient has no known allergies.  History reviewed. No pertinent family history.  Social History Social History  Substance Use Topics  . Smoking status: Never  Smoker  . Smokeless tobacco: Never Used  . Alcohol use No     Review of Systems  Constitutional: No fever/chills Eyes: No visual changes. No discharge ENT: Patient has left upper jaw pain. Cardiovascular: no chest pain. Respiratory: no cough. No SOB. Skin: Negative for rash, abrasions, lacerations, ecchymosis. Neurological: Negative for headaches, focal weakness or numbness. ____________________________________________   PHYSICAL EXAM:  VITAL SIGNS: ED Triage Vitals  Enc Vitals Group     BP 11/01/16 2034 135/76     Pulse Rate 11/01/16 2034 79     Resp 11/01/16 2034 16     Temp 11/01/16 2034 97.9 F (36.6 C)     Temp Source 11/01/16 2034 Oral     SpO2 11/01/16 2034 99 %     Weight 11/01/16 2035 175 lb (79.4 kg)     Height 11/01/16 2035 5\' 11"  (1.803 m)     Head Circumference --      Peak Flow --      Pain Score 11/01/16 2034 8     Pain Loc --      Pain Edu? --      Excl. in GC? --      Constitutional: Alert and oriented. Well appearing and in no acute distress. Eyes: Conjunctivae are normal. PERRL. EOMI. Head: Atraumatic. ENT:      Ears: Tympanic Membranes are pearly bilaterally.      Nose: No congestion/rhinnorhea.      Mouth/Throat: Mucous membranes are moist. Patient has tenderness along the left upper jaw. No palpable edema. Patient has  numerous dental caries of the left upper jaw  Hematological/Lymphatic/Immunilogical: No cervical lymphadenopathy. Cardiovascular: Normal rate, regular rhythm. Normal S1 and S2.  Good peripheral circulation. Respiratory: Normal respiratory effort without tachypnea or retractions. Lungs CTAB. Good air entry to the bases with no decreased or absent breath sounds. Skin:  Skin is warm, dry and intact. No rash noted. Psychiatric: Mood and affect are normal. Speech and behavior are normal. Patient exhibits appropriate insight and judgement.   ____________________________________________   LABS (all labs ordered are listed, but only  abnormal results are displayed)  Labs Reviewed - No data to display ____________________________________________  EKG   ____________________________________________  RADIOLOGY   No results found.  ____________________________________________    PROCEDURES  Procedure(s) performed:    Procedures    Medications  naproxen (NAPROSYN) tablet 500 mg (not administered)  amoxicillin (AMOXIL) capsule 500 mg (not administered)     ____________________________________________   INITIAL IMPRESSION / ASSESSMENT AND PLAN / ED COURSE  Pertinent labs & imaging results that were available during my care of the patient were reviewed by me and considered in my medical decision making (see chart for details).  Review of the [place] CSRS was performed in accordance of the [facility] prior to dispensing any controlled drugs.     Assessment and plan: Dental Pain:  Patient presents to the emergency department with left upper jaw pain. On physical exam, patient has numerous dental caries visualized  and tenderness with palpation of the left upper jaw. Patient was given amoxicillin in the emergency department and naproxen. Patient was discharged with amoxicillin and naproxen. Patient was advised to seek care with a local dentist immediately. Patient voiced understanding regarding this recommendation. All patient questions were answered.     ____________________________________________  FINAL CLINICAL IMPRESSION(S) / ED DIAGNOSES  Final diagnoses:  Pain, dental      NEW MEDICATIONS STARTED DURING THIS VISIT:  New Prescriptions   AMOXICILLIN (AMOXIL) 500 MG CAPSULE    Take 1 capsule (500 mg total) by mouth 3 (three) times daily.   NAPROXEN (EC NAPROSYN) 500 MG EC TABLET    Take 1 tablet (500 mg total) by mouth 2 (two) times daily with a meal.        This chart was dictated using voice recognition software/Dragon. Despite best efforts to proofread, errors can occur which can change  the meaning. Any change was purely unintentional.    Orvil Feil, PA-C 11/01/16 2322    Minna Antis, MD 11/01/16 2356

## 2016-11-01 NOTE — ED Triage Notes (Signed)
Pt ambulatory to triage in NAD, report dental pain to left upper mouth, report hx of dental abscess.  Pt reports taking ibuprofen w/o relief.

## 2016-11-01 NOTE — Discharge Instructions (Signed)
OPTIONS FOR DENTAL FOLLOW UP CARE ° °[place] Department of Health and Human Services - Local Safety Net Dental Clinics °http://www.ncdhhs.gov/dph/oralhealth/services/safetynetclinics.htm °  °[facility] (336-562-3123) ° °[facility] [place] ([phone]) ° °[facility] ([phone] ext [phone]) ° °[facility] ([phone]) ° °[facility] ([phone]) °This clinic caters to the indigent population and is on a lottery system. °Location: °[facility], [address], [place] °Clinic Hours: °Wednesdays from 6pm - 9pm, patients seen by a lottery system. °For dates, call or go to www.med.[facility].edu/[facility]/patients/Dental-[facility] °Services: °Cleanings, fillings and simple extractions. °Payment Options: °DENTAL WORK IS FREE OF CHARGE. Bring proof of income or support. °Best way to get seen: °Arrive at 5:15 pm - this is a lottery, NOT first come/first serve, so arriving earlier will not increase your chances of being seen. °  °  °[facility] °[phone] °Select option 1 for emergencies °  °Location: °[facility], [address], [place] °Clinic Hours: °No walk-ins accepted - call the day before to schedule an appointment. °Check in times are 9:30 am and 1:30 pm. °Services: °Simple extractions, temporary fillings, pulpectomy/pulp debridement, uncomplicated abscess drainage. °Payment Options: °PAYMENT IS DUE AT THE TIME OF SERVICE.  Fee is usually $100-200, additional surgical procedures (e.g. abscess drainage) may be extra. °Cash, checks, Visa/MasterCard accepted.  Can file Medicaid if patient is covered for dental - patient should call case worker to check. °No discount for [facility] patients. °Best way to get seen: °MUST call the day before and get onto the schedule. Can usually be seen the next 1-2 days. No walk-ins accepted. °  °  °[facility] °[phone] °   °Location: °[facility], [address], [place] °Clinic Hours: °M, W, Th, F 8am or 1:30pm, Tues 9a or 1:30 - first come/first served. °Services: °Simple extractions, temporary fillings, uncomplicated abscess drainage.  You do not need to be an [place] resident. °Payment Options: °PAYMENT IS DUE AT THE TIME OF SERVICE. °Dental insurance, otherwise sliding scale - bring proof of income or support. °Depending on income and treatment needed, cost is usually $50-200. °Best way to get seen: °Arrive early as it is first come/first served. °  °  °[facility] °[phone] °  °Location: °[address] °Clinic Hours: °Mon-Thu 8a-5p °Services: °Most basic dental services including extractions and fillings. °Payment Options: °PAYMENT IS DUE AT THE TIME OF SERVICE. °Sliding scale, up to 50% off - bring proof if income or support. °Medicaid with dental option accepted. °Best way to get seen: °Call to schedule an appointment, can usually be seen within 2 weeks OR they will try to see walk-ins - show up at 8a or 2p (you may have to wait). °  °  °[facility] °919-245-2435 °[place] RESIDENTS ONLY °  °Location: °[facility], [address], [place], [place] [address] °Clinic Hours: By appointment only. °Monday - Thursday 8am-5pm, Friday 8am-12pm °Services: Cleanings, fillings, extractions. °Payment Options: °PAYMENT IS DUE AT THE TIME OF SERVICE. °Cash, Visa or MasterCard. Sliding scale - $30 minimum per service. °Best way to get seen: °Come in to office, complete packet and make an appointment - need proof of income °or support monies for each household member and proof of [place] residence. °Usually takes about a month to get in. °  °  °[facility] °[phone] °  °Location: °[address]   [place] °Clinic Hours: Walk-in Urgent Care Dental Services are offered Monday-Friday  mornings only. °The numbers of emergencies accepted daily is limited to the number of °providers available. °Maximum 15 - Mondays, Wednesdays & Thursdays °Maximum 10 - Tuesdays & Fridays °Services: °You do not need to be a [place] resident to be seen for a dental emergency. °Emergencies are defined as pain, swelling, abnormal bleeding, or dental trauma. Walkins will receive x-rays if needed. °NOTE: Dental cleaning is not an emergency. °Payment Options: °PAYMENT IS DUE AT THE TIME OF SERVICE. °Minimum co-pay is $40.00 for uninsured patients. °Minimum co-pay is $3.00 for Medicaid with dental coverage. °Dental Insurance is accepted and must be presented at time of visit. °Medicare does not cover dental. °Forms of payment: Cash, credit card, checks. °Best way to get seen: °If not previously registered with the clinic, walk-in dental registration begins at 7:15 am and is on a first come/first serve basis. °If previously registered with the clinic, call to make an appointment. °  °  °[facility] °[phone] °[place] RESIDENTS ONLY °  °Location: °[address], [place],  °Clinic Hours: °Mon-Thu 10a-2p °Services: Extractions only! °Payment Options: °FREE (donations accepted) - bring proof of income or support °Best way to get seen: °Call and schedule an appointment OR come at 8am on the 1st Monday of every month (except for holidays) when it is first come/first served. °  °  °[facility] °[phone] °  °Location: °[address], [place] °Clinic Hours: °Friday mornings °Services, Payment Options, Best way to get seen: °Call for info °

## 2016-12-04 ENCOUNTER — Telehealth: Payer: Self-pay | Admitting: Pharmacist

## 2016-12-04 NOTE — Telephone Encounter (Signed)
12/04/16 Patient [facility] eligible till April 2019.

## 2016-12-11 ENCOUNTER — Emergency Department
Admission: EM | Admit: 2016-12-11 | Discharge: 2016-12-11 | Disposition: A | Discharge status: Home / Self Care | Payer: Self-pay | Attending: Student in an Organized Health Care Education/Training Program | Admitting: Student in an Organized Health Care Education/Training Program

## 2016-12-11 ENCOUNTER — Encounter: Payer: Self-pay | Admitting: Emergency Medicine

## 2016-12-11 DIAGNOSIS — N4889 Other specified disorders of penis: Secondary | ICD-10-CM | POA: Insufficient documentation

## 2016-12-11 DIAGNOSIS — Z79899 Other long term (current) drug therapy: Secondary | ICD-10-CM | POA: Insufficient documentation

## 2016-12-11 LAB — URINALYSIS, COMPLETE (UACMP) WITH MICROSCOPIC
Bacteria, UA: NONE SEEN
Bilirubin Urine: NEGATIVE
GLUCOSE, UA: NEGATIVE mg/dL
Hgb urine dipstick: NEGATIVE
Ketones, ur: NEGATIVE mg/dL
Leukocytes, UA: NEGATIVE
Nitrite: NEGATIVE
PH: 6 (ref 5.0–8.0)
Protein, ur: NEGATIVE mg/dL
RBC / HPF: NONE SEEN RBC/hpf (ref 0–5)
SPECIFIC GRAVITY, URINE: 1.013 (ref 1.005–1.030)
SQUAMOUS EPITHELIAL / LPF: NONE SEEN

## 2016-12-11 MED ORDER — HYDROXYZINE HCL 50 MG PO TABS
50.0000 mg | ORAL_TABLET | Freq: Once | ORAL | Status: AC
  Administered 2016-12-11: 50 mg via ORAL
  Filled 2016-12-11: qty 1

## 2016-12-11 MED ORDER — HYDROXYZINE HCL 50 MG PO TABS: 50.0000 mg | ORAL_TABLET | Freq: Three times a day (TID) | ORAL | 0 refills | Status: DC | PRN

## 2016-12-11 NOTE — ED Provider Notes (Signed)
[facility] Emergency Department Provider Note   ____________________________________________   None    (approximate)  I have reviewed the triage vital signs and the nursing notes.   HISTORY  Chief Complaint Groin Swelling    HPI Samuel Zavala is a 26 y.o. male c/o penile swelling which occurred last night. Patient is not sexually active. Denies penile discharge or dysuria. Mild itching. Patient work outside Engineer, technical sales duties.   Past Medical History:  Diagnosis Date  . ADHD (attention deficit hyperactivity disorder)   . Bipolar 1 disorder (HCC)     There are no active problems to display for this patient.   History reviewed. No pertinent surgical history.  Prior to Admission medications   Medication Sig Start Date End Date Taking? Authorizing Provider  levothyroxine (SYNTHROID, LEVOTHROID) 25 MCG tablet Take 25 mcg by mouth daily before breakfast.    [provider]  mirtazapine (REMERON) 45 MG tablet Take 45 mg by mouth at bedtime.    [provider]  ondansetron (ZOFRAN ODT) 8 MG disintegrating tablet Take 1 tablet (8 mg total) by mouth 2 (two) times daily. As needed for nausea 08/10/15   Payton Mccallum, MD  risperiDONE (RISPERDAL) 1 MG tablet Take 1 mg by mouth at bedtime.    [provider]    Allergies Patient has no known allergies.  No family history on file.  Social History Social History  Substance Use Topics  . Smoking status: Never Smoker  . Smokeless tobacco: Never Used  . Alcohol use No    Review of Systems  Constitutional: No fever/chills Eyes: No visual changes. ENT: No sore throat. Cardiovascular: Denies chest pain. Respiratory: Denies shortness of breath. Gastrointestinal: No abdominal pain.  No nausea, no vomiting.  No diarrhea.  No constipation. Genitourinary: Negative for dysuria. Swollen penis Musculoskeletal: Negative for back pain. Skin: Negative for  rash. Neurological: Negative for headaches, focal weakness or numbness.   ____________________________________________   PHYSICAL EXAM:  VITAL SIGNS: ED Triage Vitals  Enc Vitals Group     BP 12/11/16 0908 (!) 142/76     Pulse Rate 12/11/16 0908 87     Resp 12/11/16 0908 16     Temp 12/11/16 0908 98.1 F (36.7 C)     Temp Source 12/11/16 0908 Oral     SpO2 12/11/16 0908 99 %     Weight 12/11/16 0906 175 lb (79.4 kg)     Height 12/11/16 0908 5\' 11"  (1.803 m)     Head Circumference --      Peak Flow --      Pain Score --      Pain Loc --      Pain Edu? --      Excl. in GC? --     Constitutional: Alert and oriented. Well appearing and in no acute distress. Eyes: Conjunctivae are normal. PERRL. EOMI. Head: Atraumatic. Nose: No congestion/rhinnorhea. Mouth/Throat: Mucous membranes are moist.  Oropharynx non-erythematous. Neck: No stridor.  No cervical spine tenderness to palpation.* Hematological/Lymphatic/Immunilogical: No inguinal lymphadenopathy. Cardiovascular: Normal rate, regular rhythm. Grossly normal heart sounds.  Good peripheral circulation. Respiratory: Normal respiratory effort.  No retractions. Lungs CTAB. Gastrointestinal: Soft and nontender. No distention. No abdominal bruits. No CVA tenderness. Musculoskeletal: No lower extremity tenderness nor edema.  No joint effusions. Neurologic:  Normal speech and language. No gross focal neurologic deficits are appreciated. No gait instability. Skin:  Skin is warm, dry and intact. No rash noted. Circumcised penis with subjective edema .  Psychiatric: Mood and affect are normal. Speech and behavior are normal.  ____________________________________________   LABS (all labs ordered are listed, but only abnormal results are displayed)  Labs Reviewed - No data to display ____________________________________________  EKG   ____________________________________________  RADIOLOGY  No results  found.  ____________________________________________   PROCEDURES  Procedure(s) performed: None  Procedures  Critical Care performed: No  ____________________________________________   INITIAL IMPRESSION / ASSESSMENT AND PLAN / ED COURSE  Pertinent labs & imaging results that were available during my care of the patient were reviewed by me and considered in my medical decision making (see chart for details).  Subjective penis edema. Discussed negative urine results with patient. Patient given a prescription for Atarax and advised follow-up urology if condition persists.      ____________________________________________   FINAL CLINICAL IMPRESSION(S) / ED DIAGNOSES  Final diagnoses:  None      NEW MEDICATIONS STARTED DURING THIS VISIT:  New Prescriptions   No medications on file     Note:  This document was prepared using Dragon voice recognition software and may include unintentional dictation errors.    Joni ReiningSmith, Walter Grima K, PA-C 12/11/16 1057    Emily FilbertWilliams, Jonathan E, MD 12/11/16 1121

## 2016-12-11 NOTE — ED Triage Notes (Signed)
Pt presents with swelling to penis since last night. 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

## 2016-12-11 NOTE — ED Notes (Signed)
See triage note  States he woke up swelling to penis.Marland Kitchen. He noticed some swelling last pm no discharge no urinary sx;s  And states he is not sexually active

## 2016-12-11 NOTE — Discharge Instructions (Signed)
Take medication as directed. If no improvement in 3 days contact Urologist.

## 2017-02-27 ENCOUNTER — Encounter: Payer: Self-pay | Admitting: Emergency Medicine

## 2017-02-27 ENCOUNTER — Emergency Department
Admission: EM | Admit: 2017-02-27 | Discharge: 2017-02-27 | Disposition: A | Discharge status: Home / Self Care | Payer: Self-pay | Attending: Emergency Medicine | Admitting: Emergency Medicine

## 2017-02-27 DIAGNOSIS — Z79899 Other long term (current) drug therapy: Secondary | ICD-10-CM | POA: Insufficient documentation

## 2017-02-27 DIAGNOSIS — K0889 Other specified disorders of teeth and supporting structures: Secondary | ICD-10-CM | POA: Insufficient documentation

## 2017-02-27 DIAGNOSIS — K047 Periapical abscess without sinus: Secondary | ICD-10-CM | POA: Insufficient documentation

## 2017-02-27 HISTORY — DX: Disorder of thyroid, unspecified: E07.9

## 2017-02-27 MED ORDER — CLINDAMYCIN HCL 300 MG PO CAPS: 300.0000 mg | ORAL_CAPSULE | Freq: Four times a day (QID) | ORAL | 0 refills | Status: AC

## 2017-02-27 MED ORDER — IBUPROFEN 800 MG PO TABS
800.0000 mg | ORAL_TABLET | Freq: Once | ORAL | Status: AC
  Administered 2017-02-27: 800 mg via ORAL
  Filled 2017-02-27: qty 1

## 2017-02-27 MED ORDER — IBUPROFEN 800 MG PO TABS: 800.0000 mg | ORAL_TABLET | Freq: Three times a day (TID) | ORAL | 0 refills | Status: AC | PRN

## 2017-02-27 MED ORDER — CLINDAMYCIN PHOSPHATE 600 MG/4ML IJ SOLN
600.0000 mg | Freq: Once | INTRAMUSCULAR | Status: AC
  Administered 2017-02-27: 600 mg via INTRAMUSCULAR
  Filled 2017-02-27 (×2): qty 4

## 2017-02-27 NOTE — ED Provider Notes (Signed)
[facility] Emergency Department Provider Note   ____________________________________________   I have reviewed the triage vital signs and the nursing notes.   HISTORY  Chief Complaint Dental Pain and Abscess    HPI Samuel Zavala is a 26 y.o. male presents to the emergency department with left lower jaw pain that started yesterday with significant increase in swelling and erythema developing along the oral mucosa and left lower superficial cheek area as today progressed. Patient reports having dental work on the right lower jaw approximately a month ago and is awaiting to have dental work on the left lower jaw. Patient denies any recent dental trauma however endorses significant dental caries along the left lower gumline . Patient denies any associated fever, chills, nausea or vomiting associated with current symptoms. Patient reports dental infections in the past however denies dental abscesses. Patient denies fever, chills, headache, vision changes, chest pain, chest tightness, shortness of breath, abdominal pain, nausea and vomiting.  Past Medical History:  Diagnosis Date  . ADHD (attention deficit hyperactivity disorder)   . Bipolar 1 disorder (HCC)   . Thyroid disease     There are no active problems to display for this patient.   Past Surgical History:  Procedure Laterality Date  . FINGER SURGERY Right    second finger    Prior to Admission medications   Medication Sig Start Date End Date Taking? Authorizing Provider  clindamycin (CLEOCIN) 300 MG capsule Take 1 capsule (300 mg total) by mouth 4 (four) times daily. 02/27/17 03/09/17  Analycia Khokhar M, PA-C  hydrOXYzine (ATARAX/VISTARIL) 50 MG tablet Take 1 tablet (50 mg total) by mouth 3 (three) times daily as needed for itching. 12/11/16   Joni Reining, PA-C  ibuprofen (ADVIL,MOTRIN) 800 MG tablet Take 1 tablet (800 mg total) by mouth every 8 (eight) hours as needed for moderate pain. 02/27/17    Izel Eisenhardt M, PA-C  levothyroxine (SYNTHROID, LEVOTHROID) 25 MCG tablet Take 25 mcg by mouth daily before breakfast.    [provider]  mirtazapine (REMERON) 45 MG tablet Take 45 mg by mouth at bedtime.    [provider]  ondansetron (ZOFRAN ODT) 8 MG disintegrating tablet Take 1 tablet (8 mg total) by mouth 2 (two) times daily. As needed for nausea 08/10/15   Payton Mccallum, MD  risperiDONE (RISPERDAL) 1 MG tablet Take 1 mg by mouth at bedtime.    [provider]    Allergies Patient has no known allergies.  No family history on file.  Social History Social History  Substance Use Topics  . Smoking status: Never Smoker  . Smokeless tobacco: Never Used  . Alcohol use Yes     Comment: rarely    Review of Systems Constitutional: Negative for fever/chills Eyes: No visual changes. ENT:  Negative for sore throat and for difficulty swallowing. Left lower jaw dental pain with erythema and swelling. Cardiovascular: Denies chest pain. Respiratory: Denies cough. Denies shortness of breath. Gastrointestinal: No abdominal pain.  No nausea, vomiting, diarrhea. Skin: Negative for rash. Neurological: Negative for headaches.  ____________________________________________   PHYSICAL EXAM:  VITAL SIGNS: Patient Vitals for the past 24 hrs:  BP Temp Temp src Pulse Resp SpO2 Height Weight  02/27/17 1914 127/86 99.2 F (37.3 C) Oral 87 18 98 %  (1.803 m) 79.4 kg (175 lb)    Constitutional: Alert and oriented. Well appearing and in mild distress.  Eyes: Conjunctivae are normal. PERRL. EOMI  Head: Normocephalic and atraumatic. ENT:  Ears: Canals clear. TMs intact bilaterally.      Nose: No congestion/rhinnorhea.      Mouth/Throat: Mucous membranes are moist. Oropharynx non-erythematous or edematous. Tonsils bilaterally symmetrical. Uvula midline. Left lower jaw gum line erythema with swelling along first and second molars. Swelling and induration along  oral mucosa visible along the external cheek and lower jawline with erythema. Dental caries present in premolars and molars. Neck:Supple. No thyromegaly. No stridor.  Cardiovascular: Normal rate, regular rhythm. Normal S1 and S2.  Good peripheral circulation. Respiratory: Normal respiratory effort without tachypnea or retractions. Lungs CTAB. No wheezes/rales/rhonchi. Hematological/Lymphatic/Immunological: No cervical lymphadenopathy. Cardiovascular: Normal rate, regular rhythm. Normal distal pulses. Gastrointestinal: Bowel sounds 4 quadrants. Soft and nontender to palpation.  Musculoskeletal: Nontender with normal range of motion in all extremities. Neurologic: Normal speech and language.  Skin:  Skin is warm, dry and intact. No rash noted. Psychiatric: Mood and affect are normal. Speech and behavior are normal. Patient exhibits appropriate insight and judgement.  ____________________________________________   LABS (all labs ordered are listed, but only abnormal results are displayed)  Labs Reviewed - No data to display ____________________________________________  EKG none ____________________________________________  RADIOLOGY none ____________________________________________   PROCEDURES  Procedure(s) performed: no    Critical Care performed: no ____________________________________________   INITIAL IMPRESSION / ASSESSMENT AND PLAN / ED COURSE  Pertinent labs & imaging results that were available during my care of the patient were reviewed by me and considered in my medical decision making (see chart for details).  Patient presents to emergency department with pain along left lower jaw/gumline and swelling with induration. History and physical exam findings are reassuring symptoms are consistent with dental infection. Patient given clindamycin 600 mg IM during the course of care in the emergency department. Patient will be prescribed clindamycin for antibiotic coverage  and ibuprofen for pain and inflammation. Patient advised to follow up with dental clinic as soon as possible or return to the emergency department if symptoms return or worsen. Patient afebrile and vital signs reassuring at time of discharge. Patient informed of clinical course, understand medical decision-making process, and agree with plan.   ____________________________________________   FINAL CLINICAL IMPRESSION(S) / ED DIAGNOSES  Final diagnoses:  Dental infection  Pain, dental       NEW MEDICATIONS STARTED DURING THIS VISIT:  New Prescriptions   CLINDAMYCIN (CLEOCIN) 300 MG CAPSULE    Take 1 capsule (300 mg total) by mouth 4 (four) times daily.   IBUPROFEN (ADVIL,MOTRIN) 800 MG TABLET    Take 1 tablet (800 mg total) by mouth every 8 (eight) hours as needed for moderate pain.     Note:  This document was prepared using Dragon voice recognition software and may include unintentional dictation errors.    Clois ComberLittle, Kinleigh Nault M, PA-C 02/27/17 2220    Jeanmarie PlantMcShane, James A, MD 02/27/17 [phone]

## 2017-02-27 NOTE — ED Triage Notes (Signed)
Patient with complaint of left lower dental pain that started yesterday. Patient with swelling that started today.

## 2017-02-27 NOTE — Discharge Instructions (Signed)
OPTIONS FOR DENTAL FOLLOW UP CARE ° °[place] Department of Health and Human Services - Local Safety Net Dental Clinics °http://www.ncdhhs.gov/dph/oralhealth/services/safetynetclinics.htm °  °[facility] (336-562-3123) ° °[facility] ([phone]) ° °[facility] ([phone] ext [phone]) ° °[facility] ([phone]) ° °[facility] ([phone]) °This clinic caters to the indigent population and is on a lottery system. °Location: °[facility], [address], [place] °Clinic Hours: °Wednesdays from 6pm - 9pm, patients seen by a lottery system. °For dates, call or go to [email or web address] °Services: °Cleanings, fillings and simple extractions. °Payment Options: °DENTAL WORK IS FREE OF CHARGE. Bring proof of income or support. °Best way to get seen: °Arrive at 5:15 pm - this is a lottery, NOT first come/first serve, so arriving earlier will not increase your chances of being seen. °  °  °[facility] °[phone] °Select option 1 for emergencies °  °Location: °[facility], [address], [place] °Clinic Hours: °No walk-ins accepted - call the day before to schedule an appointment. °Check in times are 9:30 am and 1:30 pm. °Services: °Simple extractions, temporary fillings, pulpectomy/pulp debridement, uncomplicated abscess drainage. °Payment Options: °PAYMENT IS DUE AT THE TIME OF SERVICE.  Fee is usually $100-200, additional surgical procedures (e.g. abscess drainage) may be extra. °Cash, checks, Visa/MasterCard accepted.  Can file Medicaid if patient is covered for dental - patient should call case worker to check. °No discount for [facility] patients. °Best way to get seen: °MUST call the day before and get onto the schedule. Can usually be seen the next 1-2 days. No walk-ins accepted. °  °  °[facility] °[phone] °   °Location: °[facility], [address], [place] °Clinic Hours: °M, W, Th, F 8am or 1:30pm, Tues 9a or 1:30 - first come/first served. °Services: °Simple extractions, temporary fillings, uncomplicated abscess drainage.  You do not need to be an [place] resident. °Payment Options: °PAYMENT IS DUE AT THE TIME OF SERVICE. °Dental insurance, otherwise sliding scale - bring proof of income or support. °Depending on income and treatment needed, cost is usually $50-200. °Best way to get seen: °Arrive early as it is first come/first served. °  °  °[facility] °[phone] °  °Location: °[address] °Clinic Hours: °Mon-Thu 8a-5p °Services: °Most basic dental services including extractions and fillings. °Payment Options: °PAYMENT IS DUE AT THE TIME OF SERVICE. °Sliding scale, up to 50% off - bring proof if income or support. °Medicaid with dental option accepted. °Best way to get seen: °Call to schedule an appointment, can usually be seen within 2 weeks OR they will try to see walk-ins - show up at 8a or 2p (you may have to wait). °  °  °[facility] °[phone] °[place] RESIDENTS ONLY °  °Location: °[facility], [address], [place], [place] [address] °Clinic Hours: By appointment only. °Monday - Thursday 8am-5pm, Friday 8am-12pm °Services: Cleanings, fillings, extractions. °Payment Options: °PAYMENT IS DUE AT THE TIME OF SERVICE. °Cash, Visa or MasterCard. Sliding scale - $30 minimum per service. °Best way to get seen: °Come in to office, complete packet and make an appointment - need proof of income °or support monies for each household member and proof of [place] residence. °Usually takes about a month to get in. °  °  °[facility] °[phone] °  °Location: °[address]   [place] °Clinic Hours: Walk-in Urgent Care Dental Services are offered Monday-Friday  mornings only. °The numbers of emergencies accepted daily is limited to the number of °providers available. °Maximum 15 - Mondays, Wednesdays & Thursdays °Maximum 10 - Tuesdays & Fridays °Services: °You do not need to be a [place] resident to be seen for a dental emergency. °Emergencies are defined as pain, swelling, abnormal bleeding, or dental trauma. Walkins will receive x-rays if needed. °NOTE: Dental cleaning is not an emergency. °Payment Options: °PAYMENT IS DUE AT THE TIME OF SERVICE. °Minimum co-pay is $40.00 for uninsured patients. °Minimum co-pay is $3.00 for Medicaid with dental coverage. °Dental Insurance is accepted and must be presented at time of visit. °Medicare does not cover dental. °Forms of payment: Cash, credit card, checks. °Best way to get seen: °If not previously registered with the clinic, walk-in dental registration begins at 7:15 am and is on a first come/first serve basis. °If previously registered with the clinic, call to make an appointment. °  °  °[facility] °[phone] °[place] RESIDENTS ONLY °  °Location: °[address], [place], [place] °Clinic Hours: °Mon-Thu 10a-2p °Services: Extractions only! °Payment Options: °FREE (donations accepted) - bring proof of income or support °Best way to get seen: °Call and schedule an appointment OR come at 8am on the 1st Monday of every month (except for holidays) when it is first come/first served. °  °  °[facility] °[phone] °  °Location: °[address], [place] °Clinic Hours: °Friday mornings °Services, Payment Options, Best way to get seen: °Call for info °

## 2017-02-27 NOTE — ED Notes (Signed)
Pt states pain to left lower jaw/teeth, swelling started approx 1830. Pt states hx same, not this bad before. Denies drainage, fevers. Painful with eating.

## 2017-08-27 ENCOUNTER — Telehealth: Payer: Self-pay | Admitting: Pharmacy Technician

## 2017-08-27 ENCOUNTER — Ambulatory Visit: Payer: Self-pay

## 2017-08-27 NOTE — Telephone Encounter (Signed)
Received updated proof of income.  Patient eligible to receive medication assistance at [facility] through 2019, as long as eligibility requirements continue to be met.  [facility]

## 2017-09-06 ENCOUNTER — Encounter: Payer: Self-pay | Admitting: Emergency Medicine

## 2017-09-06 ENCOUNTER — Emergency Department
Admission: EM | Admit: 2017-09-06 | Discharge: 2017-09-06 | Disposition: A | Discharge status: Home / Self Care | Payer: Self-pay | Attending: Emergency Medicine | Admitting: Emergency Medicine

## 2017-09-06 DIAGNOSIS — Z79899 Other long term (current) drug therapy: Secondary | ICD-10-CM | POA: Insufficient documentation

## 2017-09-06 DIAGNOSIS — K047 Periapical abscess without sinus: Secondary | ICD-10-CM | POA: Insufficient documentation

## 2017-09-06 MED ORDER — AMOXICILLIN 500 MG PO CAPS: 500.0000 mg | ORAL_CAPSULE | Freq: Three times a day (TID) | ORAL | 0 refills | Status: AC

## 2017-09-06 MED ORDER — KETOROLAC TROMETHAMINE 30 MG/ML IJ SOLN
30.0000 mg | Freq: Once | INTRAMUSCULAR | Status: AC
  Administered 2017-09-06: 30 mg via INTRAMUSCULAR
  Filled 2017-09-06: qty 1

## 2017-09-06 MED ORDER — KETOROLAC TROMETHAMINE 10 MG PO TABS: 10.0000 mg | ORAL_TABLET | Freq: Four times a day (QID) | ORAL | 0 refills | Status: AC | PRN

## 2017-09-06 MED ORDER — AMOXICILLIN 500 MG PO CAPS
500.0000 mg | ORAL_CAPSULE | Freq: Once | ORAL | Status: AC
  Administered 2017-09-06: 500 mg via ORAL
  Filled 2017-09-06: qty 1

## 2017-09-06 NOTE — ED Notes (Signed)
See triage note  States he developed left lower gumline/dental pain for couple of days  Developed swelling to left lower jaw this afternoon  Hx of dental abscesses and has "bad "teeth

## 2017-09-06 NOTE — Discharge Instructions (Signed)
OPTIONS FOR DENTAL FOLLOW UP CARE ° °[place] Department of Health and Human Services - Local Safety Net Dental Clinics °http://www.ncdhhs.gov/dph/oralhealth/services/safetynetclinics.htm °  °[facility] ([phone]) ° °[facility] ([phone]) ° °[facility] ([phone] ext [phone]) ° °[facility] ([phone]) ° °[facility] ([phone]) °This clinic caters to the indigent population and is on a lottery system. °Location: °[facility], [address], [place] °Clinic Hours: °Wednesdays from 6pm - 9pm, patients seen by a lottery system. °For dates, call or go to [email or web address] °Services: °Cleanings, fillings and simple extractions. °Payment Options: °DENTAL WORK IS FREE OF CHARGE. Bring proof of income or support. °Best way to get seen: °Arrive at 5:15 pm - this is a lottery, NOT first come/first serve, so arriving earlier will not increase your chances of being seen. °  °  °[facility] °[phone] °Select option 1 for emergencies °  °Location: °[facility], [address], [place] °Clinic Hours: °No walk-ins accepted - call the day before to schedule an appointment. °Check in times are 9:30 am and 1:30 pm. °Services: °Simple extractions, temporary fillings, pulpectomy/pulp debridement, uncomplicated abscess drainage. °Payment Options: °PAYMENT IS DUE AT THE TIME OF SERVICE.  Fee is usually $100-200, additional surgical procedures (e.g. abscess drainage) may be extra. °Cash, checks, Visa/MasterCard accepted.  Can file Medicaid if patient is covered for dental - patient should call case worker to check. °No discount for [facility] patients. °Best way to get seen: °MUST call the day before and get onto the schedule. Can usually be seen the next 1-2 days. No walk-ins accepted. °  °  °[facility] °[phone] °   °Location: °[facility], [address], [place] °Clinic Hours: °M, W, Th, F 8am or 1:30pm, Tues 9a or 1:30 - first come/first served. °Services: °Simple extractions, temporary fillings, uncomplicated abscess drainage.  You do not need to be an [place] resident. °Payment Options: °PAYMENT IS DUE AT THE TIME OF SERVICE. °Dental insurance, otherwise sliding scale - bring proof of income or support. °Depending on income and treatment needed, cost is usually $50-200. °Best way to get seen: °Arrive early as it is first come/first served. °  °  °[facility] °[phone] °  °Location: °[address] °Clinic Hours: °Mon-Thu 8a-5p °Services: °Most basic dental services including extractions and fillings. °Payment Options: °PAYMENT IS DUE AT THE TIME OF SERVICE. °Sliding scale, up to 50% off - bring proof if income or support. °Medicaid with dental option accepted. °Best way to get seen: °Call to schedule an appointment, can usually be seen within 2 weeks OR they will try to see walk-ins - show up at 8a or 2p (you may have to wait). °  °  °[facility] °[phone] °[place] RESIDENTS ONLY °  °Location: °[facility], [address], [place], [place] [address] °Clinic Hours: By appointment only. °Monday - Thursday 8am-5pm, Friday 8am-12pm °Services: Cleanings, fillings, extractions. °Payment Options: °PAYMENT IS DUE AT THE TIME OF SERVICE. °Cash, Visa or MasterCard. Sliding scale - $30 minimum per service. °Best way to get seen: °Come in to office, complete packet and make an appointment - need proof of income °or support monies for each household member and proof of [place] residence. °Usually takes about a month to get in. °  °  °[facility] °[phone] °  °Location: °[address]   [place] °Clinic Hours: Walk-in Urgent Care Dental Services are offered Monday-Friday  mornings only. °The numbers of emergencies accepted daily is limited to the number of °providers available. °Maximum 15 - Mondays, Wednesdays & Thursdays °Maximum 10 - Tuesdays & Fridays °Services: °You do not need to be a [place] resident to be seen for a dental emergency. °Emergencies are defined as pain, swelling, abnormal bleeding, or dental trauma. Walkins will receive x-rays if needed. °NOTE: Dental cleaning is not an emergency. °Payment Options: °PAYMENT IS DUE AT THE TIME OF SERVICE. °Minimum co-pay is $40.00 for uninsured patients. °Minimum co-pay is $3.00 for Medicaid with dental coverage. °Dental Insurance is accepted and must be presented at time of visit. °Medicare does not cover dental. °Forms of payment: Cash, credit card, checks. °Best way to get seen: °If not previously registered with the clinic, walk-in dental registration begins at 7:15 am and is on a first come/first serve basis. °If previously registered with the clinic, call to make an appointment. °  °  °[facility] °[phone] °[place] RESIDENTS ONLY °  °Location: °[address], [place], [place] °Clinic Hours: °Mon-Thu 10a-2p °Services: Extractions only! °Payment Options: °FREE (donations accepted) - bring proof of income or support °Best way to get seen: °Call and schedule an appointment OR come at 8am on the 1st Monday of every month (except for holidays) when it is first come/first served. °  °  °[facility] °[phone] °  °Location: °[address], [place] °Clinic Hours: °Friday mornings °Services, Payment Options, Best way to get seen: °Call for info °

## 2017-09-06 NOTE — ED Provider Notes (Signed)
[facility] Emergency Department Provider Note  ____________________________________________  Time seen: Approximately 4:47 PM  I have reviewed the triage vital signs and the nursing notes.   HISTORY  Chief Complaint Dental Pain    HPI Samuel Zavala is a 27 y.o. male presents to the emergency department with a broken inferior 19.  Patient reports that he has had multiple dental abscesses in the past but does not currently have dental insurance and cannot afford dental care.  He denies fever and chills but rates his pain at 10 out of 10 in intensity.  No alleviating measures have yet been attempted.   Past Medical History:  Diagnosis Date  . ADHD (attention deficit hyperactivity disorder)   . Bipolar 1 disorder (HCC)   . Thyroid disease     There are no active problems to display for this patient.   Past Surgical History:  Procedure Laterality Date  . FINGER SURGERY Right    second finger    Prior to Admission medications   Medication Sig Start Date End Date Taking? Authorizing Provider  amoxicillin (AMOXIL) 500 MG capsule Take 1 capsule (500 mg total) by mouth 3 (three) times daily for 10 days. 09/06/17 09/16/17  Orvil FeilWoods, Robbin Escher M, PA-C  hydrOXYzine (ATARAX/VISTARIL) 50 MG tablet Take 1 tablet (50 mg total) by mouth 3 (three) times daily as needed for itching. 12/11/16   Joni ReiningSmith, Ronald K, PA-C  ibuprofen (ADVIL,MOTRIN) 800 MG tablet Take 1 tablet (800 mg total) by mouth every 8 (eight) hours as needed for moderate pain. 02/27/17   Little, Traci M, PA-C  ketorolac (TORADOL) 10 MG tablet Take 1 tablet (10 mg total) by mouth every 6 (six) hours as needed for up to 5 days. 09/06/17 09/11/17  Orvil FeilWoods, Cashtyn Pouliot M, PA-C  levothyroxine (SYNTHROID, LEVOTHROID) 25 MCG tablet Take 25 mcg by mouth daily before breakfast.    [provider]  mirtazapine (REMERON) 45 MG tablet Take 45 mg by mouth at bedtime.    [provider]  ondansetron (ZOFRAN ODT) 8 MG  disintegrating tablet Take 1 tablet (8 mg total) by mouth 2 (two) times daily. As needed for nausea 08/10/15   Payton Mccallumonty, [place], MD  risperiDONE (RISPERDAL) 1 MG tablet Take 1 mg by mouth at bedtime.    [provider]    Allergies Patient has no known allergies.  No family history on file.  Social History Social History   Tobacco Use  . Smoking status: Never Smoker  . Smokeless tobacco: Never Used  Substance Use Topics  . Alcohol use: Yes    Comment: rarely  . Drug use: No     Review of Systems  Constitutional: No fever/chills Eyes: No visual changes. No discharge ENT: Patient has broken Inferior 19.  Cardiovascular: no chest pain. Respiratory: no cough. No SOB. Gastrointestinal: No abdominal pain.  No nausea, no vomiting.  No diarrhea.  No constipation. Musculoskeletal: Negative for musculoskeletal pain. Skin: Negative for rash, abrasions, lacerations, ecchymosis. Neurological: Negative for headaches, focal weakness or numbness.   ____________________________________________   PHYSICAL EXAM:  VITAL SIGNS: ED Triage Vitals  Enc Vitals Group     BP 09/06/17 1606 139/87     Pulse Rate 09/06/17 1606 80     Resp 09/06/17 1606 18     Temp 09/06/17 1606 98.1 F (36.7 C)     Temp Source 09/06/17 1606 Oral     SpO2 09/06/17 1606 97 %     Weight 09/06/17 1558 180 lb (81.6 kg)  Height 09/06/17 1558 5\' 11"  (1.803 m)     Head Circumference --      Peak Flow --      Pain Score 09/06/17 1558 8     Pain Loc --      Pain Edu? --      Excl. in GC? --      Constitutional: Alert and oriented. Well appearing and in no acute distress. Eyes: Conjunctivae are normal. PERRL. EOMI. Head: Atraumatic. ENT:      Ears: TMs are pearly.      Nose: No congestion/rhinnorhea.      Mouth/Throat: Mucous membranes are moist.  Patient has broken inferior 19 with a dental abscess visualized along the left lower jaw. Hematological/Lymphatic/Immunilogical: No cervical  lymphadenopathy.  Cardiovascular: Normal rate, regular rhythm. Normal S1 and S2.  Good peripheral circulation. Respiratory: Normal respiratory effort without tachypnea or retractions. Lungs CTAB. Good air entry to the bases with no decreased or absent breath sounds. Gastrointestinal: Bowel sounds 4 quadrants. Soft and nontender to palpation. No guarding or rigidity. No palpable masses. No distention. No CVA tenderness. Musculoskeletal: Full range of motion to all extremities. No gross deformities appreciated. Neurologic:  Normal speech and language. No gross focal neurologic deficits are appreciated.  Skin:  Skin is warm, dry and intact. No rash noted.  ____________________________________________   LABS (all labs ordered are listed, but only abnormal results are displayed)  Labs Reviewed - No data to display ____________________________________________  EKG   ____________________________________________  RADIOLOGY   No results found.  ____________________________________________    PROCEDURES  Procedure(s) performed:    Procedures    Medications  ketorolac (TORADOL) 30 MG/ML injection 30 mg (30 mg Intramuscular Given 09/06/17 1646)  amoxicillin (AMOXIL) capsule 500 mg (500 mg Oral Given 09/06/17 1646)     ____________________________________________   INITIAL IMPRESSION / ASSESSMENT AND PLAN / ED COURSE  Pertinent labs & imaging results that were available during my care of the patient were reviewed by me and considered in my medical decision making (see chart for details).  Review of the [place] CSRS was performed in accordance of the [facility] prior to dispensing any controlled drugs.     Assessment and plan Dental abscess Patient presents to the emergency department with dental pain from a broken inferior 19 and surrounding dental abscess.  Patient was treated empirically with amoxicillin.  He was given Toradol in the emergency department and discharged with  Toradol for pain.  Dental resources were provided at discharge.  Patient was advised to make an appointment with a local dentist as soon as possible.  All patient questions were answered.    ____________________________________________  FINAL CLINICAL IMPRESSION(S) / ED DIAGNOSES  Final diagnoses:  Dental abscess      NEW MEDICATIONS STARTED DURING THIS VISIT:  ED Discharge Orders        Ordered    amoxicillin (AMOXIL) 500 MG capsule  3 times daily     09/06/17 1639    ketorolac (TORADOL) 10 MG tablet  Every 6 hours PRN     09/06/17 1639          This chart was dictated using voice recognition software/Dragon. Despite best efforts to proofread, errors can occur which can change the meaning. Any change was purely unintentional.    Orvil Feil, PA-C 09/06/17 1651    Nita Sickle, MD 09/07/17 0005

## 2017-09-06 NOTE — ED Triage Notes (Signed)
Patient presents to the ED with left sided facial swelling and dental pain.  Patient reports history of similar issues with dental abscesses.  Patient states he doesn't have a Education officer, communitydentist and doesn't have insurance.  Patient appears uncomfortable during triage.

## 2017-09-25 ENCOUNTER — Telehealth: Payer: Self-pay | Admitting: Pharmacist

## 2017-09-25 NOTE — Telephone Encounter (Signed)
09/25/2017 8:31:06 AM - Strattera New Med  09/25/17 Received pharmacy printout for New Med-Strattera 40mg  Take 1 capsule by mouth every morning for 7 days, then take 2 everyday. I have added medication, printed [employer] application; mailing patient his portion to sign & return, will take Dr. Marguerite OleaMoffett his portion to sign.Forde RadonAJ

## 2018-06-27 ENCOUNTER — Telehealth: Payer: Self-pay | Admitting: Pharmacist

## 2018-06-27 NOTE — Telephone Encounter (Signed)
06/27/2018 11:19:19 AM - Wilhemena Durie refill to [employer]  06/27/2018 Faxed [employer] refill request for Strattera 40mg  Take 2 capsules daily.Forde Radon

## 2018-08-15 ENCOUNTER — Telehealth: Payer: Self-pay | Admitting: Pharmacy Technician

## 2018-08-15 NOTE — Telephone Encounter (Signed)
Received 2020 proof of income.  Patient eligible to receive medication assistance at [facility] as long as eligibility requirements continue to be met.  [facility]

## 2019-05-20 ENCOUNTER — Telehealth: Payer: Self-pay | Admitting: Pharmacy Technician

## 2019-05-20 NOTE — Telephone Encounter (Signed)
Spoke with patient making him aware that per pharmaceutical company, his Samuel Zavala should be delivered to our clinic on 05/21/19.  Patient is to call and verify that [facility] received shipment prior to coming to pick it up.  [facility]

## 2019-09-23 ENCOUNTER — Telehealth: Payer: Self-pay | Admitting: Pharmacy Technician

## 2019-09-23 NOTE — Telephone Encounter (Signed)
Received updated proof of income.  Patient eligible to receive medication assistance at [facility] until time for re-certification in 9359, and as long as eligibility requirements continue to be met.  [facility]

## 2020-03-25 ENCOUNTER — Other Ambulatory Visit: Payer: Self-pay

## 2020-03-25 ENCOUNTER — Ambulatory Visit: Payer: Self-pay | Admitting: Pharmacist

## 2020-03-25 DIAGNOSIS — E079 Disorder of thyroid, unspecified: Secondary | ICD-10-CM

## 2020-03-25 DIAGNOSIS — F4310 Post-traumatic stress disorder, unspecified: Secondary | ICD-10-CM

## 2020-03-25 DIAGNOSIS — Z79899 Other long term (current) drug therapy: Secondary | ICD-10-CM

## 2020-03-25 HISTORY — DX: Post-traumatic stress disorder, unspecified: F43.10

## 2020-03-25 HISTORY — DX: Disorder of thyroid, unspecified: E07.9

## 2020-03-25 NOTE — Progress Notes (Signed)
[facility] Visit Note  Patient: Samuel Zavala MRN: [id number] Date of Birth: April 11, 1991 [facility]: [facility]   Samuel Zavala 29 y.o. male was contacted today via telephone. He was identified by name and DOB.  He voices no concerns today at this visit.  There were no vitals taken for this visit.  Patient Information   Past Medical History:  Diagnosis Date  . ADHD (attention deficit hyperactivity disorder)   . Anxiety   . Bipolar 1 disorder (HCC)   . PTSD (post-traumatic stress disorder) 03/25/2020  . Thyroid disease 03/25/2020   Pt no longer on levothyroxine      Past Surgical History:  Procedure Laterality Date  . FINGER SURGERY Right    second finger     Family History  Problem Relation Age of Onset  . Arthritis Mother   . Arthritis Father   . Hypertension Father     New Diagnoses (since last visit):   Family Support: Comments:Good support outside of family, several people look atfer him. weekly therapy  Lifestyle Diet: Eats when he can due to job. Eats mostly at night due to job. Doesn't usually eat breakfast. Snacks during day and eats at night.    Current Exercise Habits: The patient has a physically strenuous job, but has no regular exercise apart from work. 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 Hepatitis C Screening  Never done  . COVID-19 Vaccine (1) Never done  . HIV Screening  Never done  . TETANUS/TDAP  Never done  . INFLUENZA VACCINE  Never done   Outpatient Encounter Medications as of 03/25/2020  Medication Sig  . atomoxetine (STRATTERA) 40 MG capsule Take 80 mg by mouth daily.  . hydrOXYzine (ATARAX/VISTARIL) 50 MG tablet  Take 1 tablet (50 mg total) by mouth 3 (three) times daily as needed for itching. (Patient taking differently: Take 50 mg by mouth 3 (three) times daily as needed for itching. For anxiety. Typically only takes at night for sleep.)  . ibuprofen (ADVIL,MOTRIN) 800 MG tablet Take 1 tablet (800 mg total) by mouth every 8 (eight) hours as needed for moderate pain.  . mirtazapine (REMERON) 45 MG tablet Take 45 mg by mouth at bedtime.  . risperiDONE (RISPERDAL) 1 MG tablet Take 1 mg by mouth at bedtime.  . [DISCONTINUED] levothyroxine (SYNTHROID, LEVOTHROID) 25 MCG tablet Take 25 mcg by mouth daily before breakfast.  . [DISCONTINUED] ondansetron (ZOFRAN ODT) 8 MG disintegrating tablet Take 1 tablet (8 mg total) by mouth 2 (two) times daily. As needed for nausea   No facility-administered encounter medications on file as of 03/25/2020.     Assessment and Plan:  Compliance: Takes medications as prescribed. States he takes his medications at the same time each day and does not miss any doses.  Bipolar 1, ADHD, Anxiety, PTSD:  Currently taking Strattera, Hydroxyzine, Risperidone, Mirtazapine. Denies side effects from medications.  Follows up with Dr. Merlene Pulling. Also talks with a therapist biweekly to weekly via tele-health. Mr. Bluett states "this is the best he has ever felt".  Hx of Thyroid Disease: Taken off of levothyroxine around 01/2020. Patient will follow  up with Jeralyn Ruths as needed.  Vaccine Hx: Patient states he received 2 doses of the COVID-19 vaccine. He has not received the flu vaccine.  RTC 1 year   Tawnia Schirm K. Joelene Millin, PharmD [facility] Clinic-Pharmacy Operations Coordinator [phone]

## 2020-04-28 ENCOUNTER — Other Ambulatory Visit: Payer: Self-pay

## 2020-05-31 ENCOUNTER — Other Ambulatory Visit: Payer: Self-pay

## 2020-09-08 ENCOUNTER — Ambulatory Visit: Payer: Self-pay | Admitting: Pharmacy Technician

## 2020-09-08 ENCOUNTER — Other Ambulatory Visit: Payer: Self-pay

## 2020-09-08 DIAGNOSIS — Z79899 Other long term (current) drug therapy: Secondary | ICD-10-CM

## 2020-09-08 NOTE — Progress Notes (Signed)
Received updated proof of income.  Patient eligible to receive medication assistance at [facility] until time for re-certification in 1610, and as long as eligibility requirements continue to be met.  [facility]

## 2020-09-09 ENCOUNTER — Other Ambulatory Visit: Payer: Self-pay | Admitting: Family Medicine

## 2020-09-28 ENCOUNTER — Other Ambulatory Visit: Payer: Self-pay

## 2020-09-28 MED ORDER — HYDROXYZINE HCL 50 MG PO TABS
ORAL_TABLET | ORAL | 1 refills | Status: DC
  Filled 2020-09-28: qty 30, 30d supply, fill #0
  Filled 2020-10-28: qty 30, 30d supply, fill #1

## 2020-09-28 MED ORDER — RISPERIDONE 1 MG PO TABS
ORAL_TABLET | ORAL | 1 refills | Status: DC
  Filled 2020-09-28: qty 30, 30d supply, fill #0
  Filled 2020-10-28: qty 30, 30d supply, fill #1

## 2020-09-28 MED ORDER — MIRTAZAPINE 45 MG PO TABS
ORAL_TABLET | ORAL | 1 refills | Status: DC
  Filled 2020-09-28: qty 30, 30d supply, fill #0
  Filled 2020-10-31: qty 30, 30d supply, fill #1

## 2020-09-28 MED ORDER — ATOMOXETINE HCL 40 MG PO CAPS
ORAL_CAPSULE | ORAL | 1 refills | Status: DC
  Filled 2020-09-28: qty 60, 30d supply, fill #0
  Filled 2020-10-10 – 2020-10-28 (×2): qty 60, 30d supply, fill #1

## 2020-09-30 ENCOUNTER — Other Ambulatory Visit: Payer: Self-pay

## 2020-10-07 ENCOUNTER — Other Ambulatory Visit: Payer: Self-pay

## 2020-10-10 ENCOUNTER — Other Ambulatory Visit: Payer: Self-pay

## 2020-10-10 MED FILL — Hydrochlorothiazide Cap 12.5 MG: ORAL | 30 days supply | Qty: 30 | Fill #0 | Status: AC

## 2020-10-11 ENCOUNTER — Other Ambulatory Visit: Payer: Self-pay

## 2020-10-11 MED ORDER — HYDROCHLOROTHIAZIDE 12.5 MG PO CAPS
ORAL_CAPSULE | ORAL | 5 refills | Status: DC
  Filled 2020-10-11 – 2020-11-10 (×2): qty 30, 30d supply, fill #0
  Filled 2020-12-12: qty 30, 30d supply, fill #1

## 2020-10-28 ENCOUNTER — Other Ambulatory Visit: Payer: Self-pay

## 2020-10-31 ENCOUNTER — Other Ambulatory Visit: Payer: Self-pay

## 2020-11-01 ENCOUNTER — Other Ambulatory Visit: Payer: Self-pay

## 2020-11-04 ENCOUNTER — Other Ambulatory Visit: Payer: Self-pay

## 2020-11-10 ENCOUNTER — Other Ambulatory Visit: Payer: Self-pay

## 2020-11-11 ENCOUNTER — Other Ambulatory Visit: Payer: Self-pay

## 2020-11-17 ENCOUNTER — Other Ambulatory Visit: Payer: Self-pay

## 2020-11-29 ENCOUNTER — Other Ambulatory Visit: Payer: Self-pay

## 2020-11-29 MED ORDER — RISPERIDONE 1 MG PO TABS
ORAL_TABLET | ORAL | 1 refills | Status: DC
  Filled 2020-11-29: qty 30, 30d supply, fill #0

## 2020-11-29 MED ORDER — MIRTAZAPINE 45 MG PO TABS
ORAL_TABLET | ORAL | 1 refills | Status: AC
  Filled 2020-11-29: qty 30, 30d supply, fill #0

## 2020-11-29 MED ORDER — HYDROXYZINE HCL 50 MG PO TABS
ORAL_TABLET | ORAL | 1 refills | Status: DC
  Filled 2020-11-29: qty 30, 30d supply, fill #0
  Filled 2020-12-13: qty 15, 15d supply, fill #1

## 2020-11-29 MED ORDER — ATOMOXETINE HCL 40 MG PO CAPS
ORAL_CAPSULE | ORAL | 1 refills | Status: DC
  Filled 2020-11-29: qty 60, 30d supply, fill #0

## 2020-12-12 ENCOUNTER — Other Ambulatory Visit: Payer: Self-pay

## 2020-12-13 ENCOUNTER — Other Ambulatory Visit: Payer: Self-pay

## 2020-12-22 ENCOUNTER — Other Ambulatory Visit: Payer: Self-pay

## 2020-12-27 ENCOUNTER — Other Ambulatory Visit: Payer: Self-pay

## 2020-12-27 ENCOUNTER — Telehealth: Payer: Self-pay | Admitting: Pharmacy Technician

## 2020-12-27 NOTE — Telephone Encounter (Signed)
Patient stated that he now has prescription drug coverage.  Asked for prescriptions to be transferred to [facility].  No longer meets [facility] eligibility criteria.  [name] Care Manager [facility]   [name] [place], [place]  [address]   December 27, 2020    Samuel Zavala [address] [place], [place]  [address]  Dear Samuel Zavala,  This is to inform you that you are no longer eligible to receive medication assistance at [facility].  The reason(s) are:    _____Your total gross monthly household income exceeds 300% of the Federal Poverty Level.   _____Tangible assets (savings, checking, stocks/bonds, pension, retirement, etc.) exceeds our limit.  _____You are eligible to receive benefits from [facility], [facility] or the HIV Medication Assistance             Program. _____You are eligible to receive benefits from a Medicare Part "D" plan. __X__You have prescription insurance with BCBS  ____ You are not an [facility] resident. _____Failure to provide all requested documentation (proof of income information for 2022, and/or             Patient Intake Application, DOH Attestation, Contract and Patient Advocate Form).  We regret that we are unable to help you at this time.  If your prescription coverage is terminated or your financial situation changes, please contact [facility], so that we may reassess your eligibility for our program.  If you have questions, we may be contacted at [phone].  Thank you,    [facility]

## 2020-12-30 ENCOUNTER — Other Ambulatory Visit: Payer: Self-pay

## 2020-12-30 MED ORDER — ATOMOXETINE HCL 80 MG PO CAPS: ORAL_CAPSULE | ORAL | 2 refills | Status: AC

## 2021-01-12 ENCOUNTER — Other Ambulatory Visit: Payer: Self-pay

## 2021-05-30 ENCOUNTER — Other Ambulatory Visit: Payer: Self-pay

## 2021-05-30 MED ORDER — GUANFACINE HCL ER 1 MG PO TB24: ORAL_TABLET | Freq: Two times a day (BID) | ORAL | 1 refills | Status: DC

## 2021-06-05 ENCOUNTER — Other Ambulatory Visit: Payer: Self-pay

## 2021-10-24 ENCOUNTER — Other Ambulatory Visit: Payer: Self-pay

## 2021-10-24 MED ORDER — RISPERIDONE 0.5 MG PO TABS
ORAL_TABLET | ORAL | 1 refills | Status: DC
  Filled 2021-10-24: qty 90, 30d supply, fill #0

## 2021-10-25 ENCOUNTER — Other Ambulatory Visit: Payer: Self-pay

## 2021-10-30 ENCOUNTER — Other Ambulatory Visit: Payer: Self-pay

## 2021-10-30 MED ORDER — RISPERIDONE 1 MG PO TABS
1.5000 mg | ORAL_TABLET | Freq: Every day | ORAL | 1 refills | Status: DC
  Filled 2021-10-30: qty 45, 30d supply, fill #0

## 2021-11-02 ENCOUNTER — Other Ambulatory Visit: Payer: Self-pay

## 2023-04-27 ENCOUNTER — Emergency Department
Admission: EM | Admit: 2023-04-27 | Discharge: 2023-04-28 | Disposition: A | Discharge status: Home / Self Care | Payer: BC Managed Care – PPO | Attending: Emergency Medicine | Admitting: Emergency Medicine

## 2023-04-27 DIAGNOSIS — K625 Hemorrhage of anus and rectum: Secondary | ICD-10-CM | POA: Diagnosis present

## 2023-04-27 HISTORY — DX: Essential (primary) hypertension: I10

## 2023-04-27 LAB — COMPREHENSIVE METABOLIC PANEL
ALT: 24 U/L (ref 0–44)
AST: 30 U/L (ref 15–41)
Albumin: 4 g/dL (ref 3.5–5.0)
Alkaline Phosphatase: 68 U/L (ref 38–126)
Anion gap: 5 (ref 5–15)
BUN: 15 mg/dL (ref 6–20)
CO2: 23 mmol/L (ref 22–32)
Calcium: 8.5 mg/dL — ABNORMAL LOW (ref 8.9–10.3)
Chloride: 108 mmol/L (ref 98–111)
Creatinine, Ser: 0.94 mg/dL (ref 0.61–1.24)
GFR, Estimated: >60 mL/min (ref >60)
Glucose, Bld: 132 mg/dL — ABNORMAL HIGH (ref 70–99)
Potassium: 3.5 mmol/L (ref 3.5–5.1)
Sodium: 136 mmol/L (ref 135–145)
Total Bilirubin: 0.5 mg/dL (ref <1.2)
Total Protein: 6.2 g/dL — ABNORMAL LOW (ref 6.5–8.1)

## 2023-04-27 LAB — CBC
HCT: 42.9 % (ref 39.0–52.0)
Hemoglobin: 14.6 g/dL (ref 13.0–17.0)
MCH: 29.2 pg (ref 26.0–34.0)
MCHC: 34 g/dL (ref 30.0–36.0)
MCV: 85.8 fL (ref 80.0–100.0)
Platelets: 306 10*3/uL (ref 150–400)
RBC: 5 MIL/uL (ref 4.22–5.81)
RDW: 12.7 % (ref 11.5–15.5)
WBC: 6 10*3/uL (ref 4.0–10.5)
nRBC: 0 % (ref 0.0–0.2)

## 2023-04-27 MED ORDER — ONDANSETRON 4 MG PO TBDP: ORAL_TABLET | ORAL | 0 refills | Status: DC

## 2023-04-27 NOTE — ED Provider Notes (Signed)
[facility] Provider Note    Event Date/Time   First MD Initiated Contact with Patient 04/27/23 2303     (approximate)   History   Rectal Bleeding   HPI Samuel Zavala is a 32 y.o. male who presents for evaluation of rectal bleeding.  He said he has had the bleeding on and off for about a year.  He has seen his primary care provider who told him he may have a "tear" and he got better after using some ointment but then it happened again several times.  However the last couple days has been worse, most notably today.  He said that he has had to go to the bathroom 4-5 times, and it is solid, not diarrhea, but each time when he wipes he sees blood on the toilet paper.  The stool itself (and the contents of the toilet) are light brown and not heavily bloody.  He said that he had some nausea and 1 episode of vomiting earlier today.  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  He feels fine now.  He has no chest pain, shortness of breath, current nausea, and also denies abdominal pain.  No dysuria.     Physical Exam   Triage Vital Signs: ED Triage Vitals  Encounter Vitals Group     BP 04/27/23 2100 (!) 144/90     Systolic BP Percentile --      Diastolic BP Percentile --      Pulse Rate 04/27/23 2100 98     Resp 04/27/23 2100 16     Temp 04/27/23 2100 98.5 F (36.9 C)     Temp src --      SpO2 04/27/23 2100 99 %     Weight 04/27/23 2102 100.2 kg (221 lb)     Height 04/27/23 2102 1.803 m (5\' 11" )     Head Circumference --      Peak Flow --      Pain Score 04/27/23 2102 0     Pain Loc --      Pain Education --      Exclude from Growth Chart --     Most recent vital signs: Vitals:   04/27/23 2100  BP: (!) 144/90  Pulse: 98  Resp: 16  Temp: 98.5 F (36.9 C)  SpO2: 99%    General: Awake, no distress.  Generally well-appearing. CV:  Good peripheral perfusion.   Regular rate and rhythm. Resp:  Normal effort. Speaking easily and comfortably, no accessory muscle usage nor intercostal retractions.   Abd:  No distention.  No tenderness to palpation of the abdomen. Other:  Rectal exam notable for no external hemorrhoids nor obvious anal fissure.  No visible changes nor fluctuance/induration suggestive of abscess or fistula.  Nontender digital exam.  Light brown stool that was strongly Hemoccult positive but without gross blood.  ED chaperone present throughout exam.   ED Results / Procedures / Treatments   Labs (all labs ordered are listed, but only abnormal results are displayed) Labs Reviewed  COMPREHENSIVE METABOLIC PANEL - Abnormal; Notable for the following components:      Result Value   Glucose, Bld 132 (*)    Calcium 8.5 (*)    Total Protein 6.2 (*)    All other components within normal limits  CBC  POC OCCULT BLOOD, ED       PROCEDURES:  Critical  Care performed: No  Procedures    IMPRESSION / MDM / ASSESSMENT AND PLAN / ED COURSE  I reviewed the triage vital signs and the nursing notes.                              Differential diagnosis includes, but is not limited to, internal or external hemorrhoid, diverticulosis, AV malformation, neoplasm, IBD.  Patient's presentation is most consistent with acute presentation with potential threat to life or bodily function.  Labs/studies ordered: CMP, CBC  Interventions/Medications given:  Medications - No data to display  (Note:  hospital course my include additional interventions and/or labs/studies not listed above.)   Normal vital signs.  Well-appearing patient with minimal symptoms and reassuring physical exam.  Intermittent rectal bleeding for a year is generally reassuring.  No evidence of anemia and normal metabolic panel.  I talked with him about how I suspect he has a "GI bug" at this time that is causing him to go to the bathroom more frequently although it is  reassuring he is not having diarrhea.  We talked about how most likely he has an internal hemorrhoid that is causing a small amount of bleeding that shows up on the toilet paper and on the Hemoccult but is not grossly bloody at this time.  There is no indication for imaging or admission to the hospital but I will refer him to GI and I explained that he can call to schedule follow-up appointment.  He then showed that he sometimes has quite a bit of constipation and I encouraged him to take stool softener to try and avoid exacerbating the ongoing issue.  He understands and agrees and I gave my usual and customary follow-up recommendations and return precautions.         FINAL CLINICAL IMPRESSION(S) / ED DIAGNOSES   Final diagnoses:  Rectal bleeding     Rx / DC Orders   ED Discharge Orders          Ordered    ondansetron (ZOFRAN-ODT) 4 MG disintegrating tablet        04/27/23 2359             Note:  This document was prepared using Dragon voice recognition software and may include unintentional dictation errors.   Loleta Rose, MD 04/28/23 0000

## 2023-04-27 NOTE — Discharge Instructions (Signed)
Your evaluation was reassuring tonight, and most likely you have a small internal hemorrhoid that is leading to the bleeding you see from time to time.  Remember to take your omeprazole.  Also, if you develop some constipation, we encourage you to take a stool softener which is available over-the-counter (Colace, docusate, MiraLAX, etc.).  As documented above, we recommend you call the office of Dr. Mia Creek and explain you were seen in the emergency department and would like to schedule a follow-up appointment with gastroenterology.  They should be able to help you.    Return to the emergency department if you develop new or worsening symptoms that concern you.

## 2023-04-27 NOTE — ED Triage Notes (Signed)
Pt arrived POV for intermittent rectal bleeding throughout the year, reports has used the bathroom several times today "every " solid stool, only one episode of diarrhea. Reports bright red blood only when wiping, hx of constipation,unsure if he has hemorrhoids. Pt reports lower abd pain. Denies n/v. A&O x4, NAD Noted, VSS.

## 2023-04-28 MED ORDER — ONDANSETRON 4 MG PO TBDP: ORAL_TABLET | ORAL | 0 refills | Status: DC

## 2023-05-07 ENCOUNTER — Ambulatory Visit: Payer: BC Managed Care – PPO | Admitting: Urology

## 2023-05-25 ENCOUNTER — Other Ambulatory Visit: Payer: Self-pay

## 2023-05-25 ENCOUNTER — Emergency Department
Admission: EM | Admit: 2023-05-25 | Discharge: 2023-05-26 | Disposition: A | Discharge status: Home / Self Care | Payer: BC Managed Care – PPO | Attending: Emergency Medicine | Admitting: Emergency Medicine

## 2023-05-25 ENCOUNTER — Emergency Department: Payer: BC Managed Care – PPO

## 2023-05-25 DIAGNOSIS — Y990 Civilian activity done for income or pay: Secondary | ICD-10-CM | POA: Diagnosis not present

## 2023-05-25 DIAGNOSIS — X500XXA Overexertion from strenuous movement or load, initial encounter: Secondary | ICD-10-CM | POA: Insufficient documentation

## 2023-05-25 DIAGNOSIS — M25561 Pain in right knee: Secondary | ICD-10-CM | POA: Insufficient documentation

## 2023-05-25 DIAGNOSIS — G8911 Acute pain due to trauma: Secondary | ICD-10-CM | POA: Diagnosis not present

## 2023-05-25 NOTE — ED Triage Notes (Signed)
T to ed from home via POV for right leg pain in his knee. Pt advised it is swollen and can barely walk but pt is ambulatory in triage. Pt advised it has been bothering him x 1 month. Pt knee is not swollen and is not red. Pt is caox4, and in no acute distress.

## 2023-05-26 NOTE — Discharge Instructions (Signed)

## 2023-05-26 NOTE — ED Provider Notes (Signed)
[facility] Provider Note    Event Date/Time   First MD Initiated Contact with Patient 05/25/23 2324     (approximate)   History   Knee Pain (Right)   HPI Samuel Zavala is a 32 y.o. male who presents for evaluation of pain and intermittent swelling in his right knee.  Symptoms have been present for about a month but have been worse recently.  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  His pain is usually worse at work and afterwards.  No swelling of his lower leg below his knee or above his knee.  He said the swelling was present while he was at work and it was difficult to ambulate as a result of the pain but the swelling has gotten better since then.  He has taken some ibuprofen but has not tried any cold packs, heating pads, or bandages.  He has not seen another provider about it.  He had no specific moment in time where he had acute onset of pain, it was more of a gradual onset and intermittent.  He has no history of blood clots in his legs or his lungs and no recent immobilizations, surgeries, nor travel.     Physical Exam   Triage Vital Signs: ED Triage Vitals  Encounter Vitals Group     BP 05/25/23 2051 123/79     Systolic BP Percentile --      Diastolic BP Percentile --      Pulse Rate 05/25/23 2051 90     Resp 05/25/23 2051 16     Temp 05/25/23 2051 98 F (36.7 C)     Temp Source 05/25/23 2051 Oral     SpO2 05/25/23 2051 98 %     Weight --      Height --      Head Circumference --      Peak Flow --      Pain Score 05/25/23 2048 8     Pain Loc --      Pain Education --      Exclude from Growth Chart --     Most recent vital signs: Vitals:   05/25/23 2051 05/26/23 0003  BP: 123/79 (!) 141/98  Pulse: 90 82  Resp: 16 16  Temp: 98 F (36.7 C)   SpO2: 98% 95%    General: Awake, no distress.  CV:  Good peripheral perfusion.  Resp:  Normal effort. Speaking easily and comfortably, no accessory muscle  usage nor intercostal retractions.   Abd:  No distention.  Other:  I cannot appreciate any effusions, swelling, nor erythema to his right knee.  He is able to bear weight and ambulate without any apparent difficulty.  He reports pain with flexion extension but there are no visible or palpable deformities to the knee.   ED Results / Procedures / Treatments   Labs (all labs ordered are listed, but only abnormal results are displayed) Labs Reviewed - No data to display   RADIOLOGY I viewed and interpreted the patient's right knee x-rays.  No effusions or bony abnormalities noted.  I also read the radiologist's report, which confirmed no acute findings.   PROCEDURES:  Critical Care performed: No  Procedures    IMPRESSION / MDM / ASSESSMENT AND PLAN / ED COURSE  I reviewed the triage vital signs and the nursing notes.  Differential diagnosis includes, but is not limited to, nonspecific musculoskeletal strain, ligamentous injury, arthritis, bursitis, septic joint, fracture/dislocation, DVT.  Patient's presentation is most consistent with acute presentation with potential threat to life or bodily function.  Labs/studies ordered: X-rays of the right knee  Interventions/Medications given:  Medications - No data to display  (Note:  hospital course my include additional interventions and/or labs/studies not listed above.)   Vital signs stable.  No signs or symptoms to suggest DVT.  The swelling is very much isolated to the joint itself, but it is not present right now.  Based on physical exam and history, I have no concern for a septic joint.  There is no effusion at this time.  I had my usual soft tissue/MSK management discussion with the patient and encouraged RICE and outpatient orthopedic follow-up.  I gave my usual and customary return precautions.  Patient declined crutches.         FINAL CLINICAL IMPRESSION(S) / ED DIAGNOSES   Final  diagnoses:  Acute pain of right knee     Rx / DC Orders   ED Discharge Orders     None        Note:  This document was prepared using Dragon voice recognition software and may include unintentional dictation errors.   Loleta Rose, MD 05/26/23 [phone]

## 2023-07-09 ENCOUNTER — Ambulatory Visit: Payer: BC Managed Care – PPO | Admitting: Urology

## 2023-08-13 ENCOUNTER — Ambulatory Visit (INDEPENDENT_AMBULATORY_CARE_PROVIDER_SITE_OTHER): Payer: BC Managed Care – PPO | Admitting: Urology

## 2023-08-13 VITALS — BP 125/85 | HR 80 | Ht 71.0 in | Wt 231.0 lb

## 2023-08-13 DIAGNOSIS — N3281 Overactive bladder: Secondary | ICD-10-CM | POA: Diagnosis not present

## 2023-08-13 DIAGNOSIS — K5900 Constipation, unspecified: Secondary | ICD-10-CM | POA: Diagnosis not present

## 2023-08-13 DIAGNOSIS — R3915 Urgency of urination: Secondary | ICD-10-CM

## 2023-08-13 DIAGNOSIS — R350 Frequency of micturition: Secondary | ICD-10-CM | POA: Diagnosis not present

## 2023-08-13 DIAGNOSIS — F419 Anxiety disorder, unspecified: Secondary | ICD-10-CM

## 2023-08-13 LAB — URINALYSIS, COMPLETE
Bilirubin, UA: NEGATIVE
Glucose, UA: NEGATIVE
Ketones, UA: NEGATIVE
Leukocytes,UA: NEGATIVE
Nitrite, UA: NEGATIVE
Protein,UA: NEGATIVE
RBC, UA: NEGATIVE
Specific Gravity, UA: >1.03 — ABNORMAL HIGH (ref 1.005–1.030)
Urobilinogen, Ur: 0.2 mg/dL (ref 0.2–1.0)
pH, UA: 6 (ref 5.0–7.5)

## 2023-08-13 LAB — MICROSCOPIC EXAMINATION

## 2023-08-13 LAB — BLADDER SCAN AMB NON-IMAGING: Scan Result: 74

## 2023-08-13 NOTE — Patient Instructions (Signed)

## 2023-08-13 NOTE — Progress Notes (Signed)
 I,Amy L Pierron,acting as a scribe for Vanna Scotland, MD.,have documented all relevant documentation on the behalf of Vanna Scotland, MD,as directed by  Vanna Scotland, MD while in the presence of Vanna Scotland, MD.  08/13/2023 10:30 AM   Samuel Zavala 1990/07/18 [id number]  Referring provider: Emogene Morgan, MD [address] [place],  [place] [address]  Chief Complaint  Patient presents with   Establish Care   Urinary Frequency    HPI: 33 year-old male referred for further evaluation of urinary issues, primarily urgency and frequency.   He had a urinalysis which was unremarkable. He was also negative for GC chlamydia. His PSA was 0.8. He's been empirically treated with Cipro, which didn't help. He also tried Flomax which he felt made no improvement.   He mentions since late September/ early October he has times when he urinates every 10 to 20 minutes. He has a weak stream a lot of the time, often feeling the need to strain. He is able to hold his pee, but when he frequently urinates it is a small amount. He doesn't have any accidents and denies hematuria. He said 95% of the time his urine is dark yellow, but clear.  He drinks a significant amount, 15 to 20 bottles, of water a day. He also has high caffeine intake from energy drinks, as many as 3-4 per day. However, he has since reduced to 1-2 a day. Despite these changes, his symptoms persist.   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  He has a personal history of bipolar disorder, ADHD, and PTSD which is under control with Buspar.  He denies any history of sexually transmitted diseases, catheter use, or significant genital injuries, although he recalls a re-circumcision around age 68. He doesn't remember why it had to be done again.  He experiences occasional testicular pain and has a history of bowel issues, including constipation and diarrhea. He has seen blood in his stool. He has never taken medication or over  the counter supplements for it.   Results for orders placed or performed in visit on 08/13/23  Bladder Scan (Post Void Residual) in office  Result Value Ref Range   Scan Result 74 ml     IPSS     Row Name 08/13/23 0900         International Prostate Symptom Score   How often have you had the sensation of not emptying your bladder? About half the time     How often have you had to urinate less than every two hours? Almost always     How often have you found you stopped and started again several times when you urinated? Almost always     How often have you found it difficult to postpone urination? Not at All     How often have you had a weak urinary stream? Almost always     How often have you had to strain to start urination? About half the time     How many times did you typically get up at night to urinate? 2 Times     Total IPSS Score 23       Quality of Life due to urinary symptoms   If you were to spend the rest of your life with your urinary condition just the way it is now how would you feel about that? Unhappy            Score:  1-7 Mild 8-19  Moderate 20-35 Severe  PMH: Past Medical History:  Diagnosis Date   ADHD (attention deficit hyperactivity disorder)    Anxiety    Bipolar 1 disorder (HCC)    Hypertension    PTSD (post-traumatic stress disorder) 03/25/2020   Thyroid disease 03/25/2020   Pt no longer on levothyroxine    Surgical History: Past Surgical History:  Procedure Laterality Date   FINGER SURGERY Right    second finger    Home Medications:  Allergies as of 08/13/2023   No Known Allergies      Medication List        Accurate as of August 13, 2023 10:30 AM. If you have any questions, ask your nurse or doctor.          STOP taking these medications    guanFACINE 1 MG Tb24 ER tablet Commonly known as: INTUNIV   hydrochlorothiazide 12.5 MG capsule Commonly known as: MICROZIDE   hydrOXYzine 50 MG tablet Commonly known as:  ATARAX       TAKE these medications    atomoxetine 80 MG capsule Commonly known as: Strattera TAKE ONE CAPSULE BY MOUTH ONCE DAILY. What changed: Another medication with the same name was removed. Continue taking this medication, and follow the directions you see here.   busPIRone 15 MG tablet Commonly known as: BUSPAR Take 7.5-15 mg by mouth 2 (two) times daily.   cholecalciferol 25 MCG (1000 UNIT) tablet Commonly known as: VITAMIN D3 Take 1,000 Units by mouth daily.   DULoxetine 30 MG capsule Commonly known as: CYMBALTA Take 30 mg by mouth daily.   ibuprofen 800 MG tablet Commonly known as: ADVIL Take 1 tablet (800 mg total) by mouth every 8 (eight) hours as needed for moderate pain.   mirtazapine 45 MG tablet Commonly known as: REMERON TAKE ONE TABLET (45MG  TOTAL) BY MOUTH ONCE DAILY AT BEDTIME. What changed: Another medication with the same name was removed. Continue taking this medication, and follow the directions you see here.   omeprazole 20 MG capsule Commonly known as: PRILOSEC Take 20 mg by mouth daily as needed.   ondansetron 4 MG disintegrating tablet Commonly known as: ZOFRAN-ODT Allow 1-2 tablets to dissolve in your mouth every 8 hours as needed for nausea/vomiting   propranolol ER 60 MG 24 hr capsule Commonly known as: INDERAL LA Take 60 mg by mouth at bedtime.   risperiDONE 1 MG tablet Commonly known as: RISPERDAL Take 1 mg by mouth at bedtime. What changed: Another medication with the same name was removed. Continue taking this medication, and follow the directions you see here.   Vitamin D (Ergocalciferol) 1.25 MG (50000 UNIT) Caps capsule Commonly known as: DRISDOL Take 50,000 Units by mouth once a week.        Family History: Family History  Problem Relation Age of Onset   Arthritis Mother    Arthritis Father    Hypertension Father     Social History:  reports that he has never smoked. He has never used smokeless tobacco. He  reports current alcohol use. He reports that he does not currently use drugs after having used the following drugs: Marijuana.   Physical Exam: BP 125/85   Pulse 80   Ht 5\' 11"  (1.803 m)   Wt 231 lb (104.8 kg)   BMI 32.22 kg/m   Constitutional:  Alert and oriented, No acute distress. HEENT: [place] AT, moist mucus membranes.  Trachea midline, no masses. Neurologic: Grossly intact, no focal deficits, moving all 4 extremities. Psychiatric: Normal  mood and affect.   Assessment & Plan:    1. Constipation - Differential Diagnosis: Bowel-bladder syndrome, functional constipation. Explained that constipation affects urination. The rectum gets distended and presses on the bladder. They share the same set of nerves and managing constipation is an important part of bladder function.  - Encouraged the use of stool softeners (Colace) or gentle laxatives (Miralax) to manage constipation. - Encourage regular bowel habits to potentially improve bladder function.  2. Urgency/ frequency - Schedule cystoscopy to evaluate for urethral stricture. - Highly suspicious it's likely related to anxiety. Encourage bladder training and reduction of excessive water intake; 8-10 glasses a day is sufficient. Avoid bladder irritants - Consider referral to physical therapy for pelvic floor dysfunction if cystoscopy is normal. - Monitor urine for blood or abnormalities.  3. Anxiety - Continue current medication (Buspar) for long-term anxiety management. - Use Hydroxyzine prior to cystoscopy to alleviate procedural anxiety.  Return in about 3 weeks (around 09/03/2023) for cystoscopy.  I have reviewed the above documentation for accuracy and completeness, and I agree with the above.   Vanna Scotland, MD   [facility] [address], [address] [place], [place] [address] [phone]  I spent 48 total minutes on the day of the encounter including pre-visit review of the medical record,  face-to-face time with the patient, and post visit ordering of labs/imaging/tests.

## 2023-10-09 ENCOUNTER — Ambulatory Visit (INDEPENDENT_AMBULATORY_CARE_PROVIDER_SITE_OTHER): Payer: BC Managed Care – PPO | Admitting: Urology

## 2023-10-09 VITALS — BP 143/91 | HR 65 | Ht 71.0 in | Wt 225.0 lb

## 2023-10-09 DIAGNOSIS — M6289 Other specified disorders of muscle: Secondary | ICD-10-CM

## 2023-10-09 DIAGNOSIS — N3281 Overactive bladder: Secondary | ICD-10-CM

## 2023-10-09 DIAGNOSIS — R350 Frequency of micturition: Secondary | ICD-10-CM

## 2023-10-09 LAB — MICROSCOPIC EXAMINATION: Bacteria, UA: NONE SEEN

## 2023-10-09 LAB — URINALYSIS, COMPLETE
Bilirubin, UA: NEGATIVE
Glucose, UA: NEGATIVE
Ketones, UA: NEGATIVE
Leukocytes,UA: NEGATIVE
Nitrite, UA: NEGATIVE
Protein,UA: NEGATIVE
RBC, UA: NEGATIVE
Specific Gravity, UA: 1.015 (ref 1.005–1.030)
Urobilinogen, Ur: 0.2 mg/dL (ref 0.2–1.0)
pH, UA: 7 (ref 5.0–7.5)

## 2023-10-09 NOTE — Progress Notes (Unsigned)
   10/09/23  CC:  Chief Complaint  Patient presents with   Cysto    HPI: 33 yo M with urinary frequency / urgency who presents today for anatomic evaluation   Blood pressure (!) 143/91, pulse 65, height 5\' 11"  (1.803 m), weight 225 lb (102.1 kg). NED. A&Ox3.   No respiratory distress   Abd soft, NT, ND Normal phallus with bilateral descended testicles  Cystoscopy Procedure Note  Patient identification was confirmed, informed consent was obtained, and patient was prepped using Betadine solution.  Lidocaine jelly was administered per urethral meatus.     Pre-Procedure: - Inspection reveals a normal caliber ureteral meatus.  Procedure: The flexible cystoscope was introduced without difficulty - No urethral strictures/lesions are present. - Normal prostate  - Normal bladder neck - Bilateral ureteral orifices identified - Bladder mucosa  reveals no ulcers, tumors, or lesions - No bladder stones - No trabeculation  Retroflexion un remarkable   Post-Procedure: - Patient tolerated the procedure well  Assessment/ Plan:  1. Urinary frequency (Primary) - Urinalysis, Complete - Ambulatory referral to Physical Therapy  2. Pelvic floor dysfunction Strong suspicion this as underlying contributing factor  Would benefit from PT- he is agreeable with this plan - Ambulatory referral to Physical Therapy    Dustin Gimenez, MD

## 2023-11-15 ENCOUNTER — Other Ambulatory Visit: Payer: Self-pay | Admitting: Orthopedic Surgery

## 2023-11-15 DIAGNOSIS — M25561 Pain in right knee: Secondary | ICD-10-CM

## 2023-11-25 ENCOUNTER — Ambulatory Visit
Admission: RE | Admit: 2023-11-25 | Discharge: 2023-11-25 | Disposition: A | Discharge status: Home / Self Care | Source: Ambulatory Visit | Attending: Orthopedic Surgery | Admitting: Orthopedic Surgery

## 2023-11-25 DIAGNOSIS — M25561 Pain in right knee: Secondary | ICD-10-CM | POA: Diagnosis not present

## 2023-12-23 ENCOUNTER — Ambulatory Visit: Admitting: Cardiovascular Disease

## 2024-02-10 ENCOUNTER — Ambulatory Visit: Admitting: Cardiovascular Disease

## 2024-03-10 ENCOUNTER — Ambulatory Visit: Admitting: Cardiovascular Disease

## 2024-03-23 ENCOUNTER — Emergency Department

## 2024-03-23 ENCOUNTER — Other Ambulatory Visit: Payer: Self-pay

## 2024-03-23 DIAGNOSIS — I10 Essential (primary) hypertension: Secondary | ICD-10-CM | POA: Insufficient documentation

## 2024-03-23 DIAGNOSIS — J181 Lobar pneumonia, unspecified organism: Secondary | ICD-10-CM | POA: Insufficient documentation

## 2024-03-23 DIAGNOSIS — R509 Fever, unspecified: Secondary | ICD-10-CM | POA: Insufficient documentation

## 2024-03-23 DIAGNOSIS — D72819 Decreased white blood cell count, unspecified: Secondary | ICD-10-CM | POA: Diagnosis not present

## 2024-03-23 DIAGNOSIS — R0602 Shortness of breath: Secondary | ICD-10-CM | POA: Diagnosis present

## 2024-03-23 DIAGNOSIS — E876 Hypokalemia: Secondary | ICD-10-CM | POA: Insufficient documentation

## 2024-03-23 LAB — BASIC METABOLIC PANEL WITH GFR
Anion gap: 10 (ref 5–15)
BUN: 14 mg/dL (ref 6–20)
CO2: 24 mmol/L (ref 22–32)
Calcium: 8.5 mg/dL — ABNORMAL LOW (ref 8.9–10.3)
Chloride: 103 mmol/L (ref 98–111)
Creatinine, Ser: 0.8 mg/dL (ref 0.61–1.24)
GFR, Estimated: >60 mL/min (ref >60)
Glucose, Bld: 115 mg/dL — ABNORMAL HIGH (ref 70–99)
Potassium: 3.4 mmol/L — ABNORMAL LOW (ref 3.5–5.1)
Sodium: 137 mmol/L (ref 135–145)

## 2024-03-23 LAB — CBC
HCT: 38.8 % — ABNORMAL LOW (ref 39.0–52.0)
Hemoglobin: 13 g/dL (ref 13.0–17.0)
MCH: 29.2 pg (ref 26.0–34.0)
MCHC: 33.5 g/dL (ref 30.0–36.0)
MCV: 87.2 fL (ref 80.0–100.0)
Platelets: 187 K/uL (ref 150–400)
RBC: 4.45 MIL/uL (ref 4.22–5.81)
RDW: 13.1 % (ref 11.5–15.5)
WBC: 3.7 K/uL — ABNORMAL LOW (ref 4.0–10.5)
nRBC: 0 % (ref 0.0–0.2)

## 2024-03-23 LAB — TROPONIN I (HIGH SENSITIVITY): Troponin I (High Sensitivity): 4 ng/L (ref <18)

## 2024-03-23 NOTE — Progress Notes (Deleted)
  Cardiology Office Note   Date:  03/23/2024  ID:  [name], DOB August 21, 1990, MRN [id number] PCP: [facility] Providers Cardiologist:  None { Click to update primary MD,subspecialty MD or APP then REFRESH:1}    History of Present Illness Samuel Zavala is a 33 y.o. male PMH anxiety, bipolar disorder, PTSD on multiple psychiatric agents who presents for further evaluation management of near syncope.  ***  Relevant CVD History -None   ROS: Pt denies any chest discomfort, jaw pain, arm pain, palpitations, syncope, presyncope, orthopnea, PND, or LE edema.  Studies Reviewed I have independently reviewed the patient's ECG, ***.  Physical Exam VS:  There were no vitals taken for this visit.       Wt Readings from Last 3 Encounters:  10/09/23 225 lb (102.1 kg)  08/13/23 231 lb (104.8 kg)  04/27/23 221 lb (100.2 kg)    GEN: No acute distress. NECK: No JVD; No carotid bruits. CARDIAC: ***RRR, no murmurs, rubs, gallops. RESPIRATORY:  Clear to auscultation. EXTREMITIES:  Warm and well-perfused. No edema.  ASSESSMENT AND PLAN Near syncope ***        {Are you ordering a CV Procedure (e.g. stress test, cath, DCCV, TEE, etc)?   Press F2        :[id number]}  Dispo: ***  Signed, Caron Poser, MD

## 2024-03-23 NOTE — ED Triage Notes (Signed)
 Patient ambulatory to triage with complaints of left sided chest pain and dizziness that started approx 1.5 hr PTA. 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 Describes pain as heavy pressure on his chest. Endorses also having HTN PTA, has not had BP meds tonight.

## 2024-03-24 ENCOUNTER — Emergency Department
Admission: EM | Admit: 2024-03-24 | Discharge: 2024-03-24 | Disposition: A | Discharge status: Home / Self Care | Attending: Emergency Medicine | Admitting: Emergency Medicine

## 2024-03-24 ENCOUNTER — Emergency Department

## 2024-03-24 ENCOUNTER — Ambulatory Visit

## 2024-03-24 DIAGNOSIS — J189 Pneumonia, unspecified organism: Secondary | ICD-10-CM

## 2024-03-24 LAB — RESP PANEL BY RT-PCR (RSV, FLU A&B, COVID)  RVPGX2
Influenza A by PCR: NEGATIVE
Influenza B by PCR: NEGATIVE
Resp Syncytial Virus by PCR: NEGATIVE
SARS Coronavirus 2 by RT PCR: NEGATIVE

## 2024-03-24 LAB — TROPONIN I (HIGH SENSITIVITY): Troponin I (High Sensitivity): 4 ng/L (ref <18)

## 2024-03-24 MED ORDER — IOHEXOL 350 MG/ML SOLN
100.0000 mL | Freq: Once | INTRAVENOUS | Status: AC | PRN
  Administered 2024-03-24: 100 mL via INTRAVENOUS

## 2024-03-24 MED ORDER — LACTATED RINGERS IV BOLUS
1000.0000 mL | Freq: Once | INTRAVENOUS | Status: AC
  Administered 2024-03-24: 1000 mL via INTRAVENOUS

## 2024-03-24 MED ORDER — AMOXICILLIN 500 MG PO CAPS: 1000.0000 mg | ORAL_CAPSULE | Freq: Three times a day (TID) | ORAL | 0 refills | Status: AC

## 2024-03-24 MED ORDER — ACETAMINOPHEN 500 MG PO TABS
1000.0000 mg | ORAL_TABLET | Freq: Once | ORAL | Status: AC
  Administered 2024-03-24: 1000 mg via ORAL
  Filled 2024-03-24: qty 2

## 2024-03-24 MED ORDER — SODIUM CHLORIDE 0.9 % IV SOLN
2.0000 g | Freq: Once | INTRAVENOUS | Status: AC
Start: 2024-03-24 — End: 2024-03-24
  Administered 2024-03-24: 2 g via INTRAVENOUS
  Filled 2024-03-24: qty 20

## 2024-03-24 MED ORDER — SODIUM CHLORIDE 0.9 % IV SOLN
500.0000 mg | Freq: Once | INTRAVENOUS | Status: AC
  Administered 2024-03-24: 500 mg via INTRAVENOUS
  Filled 2024-03-24: qty 5

## 2024-03-24 NOTE — ED Provider Notes (Signed)
 [facility] Provider Note    Event Date/Time   First MD Initiated Contact with Patient 03/24/24 0209     (approximate)   History   Chief Complaint Chest Pain   HPI  Samuel Zavala is a 33 y.o. male with past medical history of hypertension, bipolar disorder, and PTSD who presents to the ED complaining of chest pain.  Patient reports that he had sudden onset of sharp pain in the center of his chest about 1 hour prior to arrival.  This been associated with some shortness of breath as well as dizziness and lightheadedness.  He does report that he has been feeling dizzy and lightheaded for the past few days, almost like he is going to pass out at times.  He does endorse fevers as high as 102 the past couple of nights along with diffuse bodyaches.  He is not aware of any sick contacts and denies any nausea, vomiting, or diarrhea.     Physical Exam   Triage Vital Signs: ED Triage Vitals  Encounter Vitals Group     BP 03/23/24 2313 (!) 137/96     Girls Systolic BP Percentile --      Girls Diastolic BP Percentile --      Boys Systolic BP Percentile --      Boys Diastolic BP Percentile --      Pulse Rate 03/23/24 2313 (!) 114     Resp 03/23/24 2313 (!) 22     Temp 03/23/24 2313 98.2 F (36.8 C)     Temp Source 03/23/24 2313 Oral     SpO2 03/23/24 2313 99 %     Weight 03/23/24 2315 243 lb (110.2 kg)     Height 03/23/24 2315 5' 11 (1.803 m)     Head Circumference --      Peak Flow --      Pain Score 03/23/24 2314 8     Pain Loc --      Pain Education --      Exclude from Growth Chart --     Most recent vital signs: Vitals:   03/24/24 0505 03/24/24 0514  BP: 118/68   Pulse: 96   Resp:    Temp:  99.8 F (37.7 C)  SpO2: 93%     Constitutional: Alert and oriented. Eyes: Conjunctivae are normal. Head: Atraumatic. Nose: No congestion/rhinnorhea. Mouth/Throat: Mucous membranes are moist.  Cardiovascular: Tachycardic, regular rhythm. Grossly  normal heart sounds.  2+ radial pulses bilaterally. Respiratory: Tachypneic with normal respiratory effort.  No retractions. Lungs CTAB. Gastrointestinal: Soft and nontender. No distention. Musculoskeletal: No lower extremity tenderness nor edema.  Neurologic:  Normal speech and language. No gross focal neurologic deficits are appreciated.    ED Results / Procedures / Treatments   Labs (all labs ordered are listed, but only abnormal results are displayed) Labs Reviewed  BASIC METABOLIC PANEL WITH GFR - Abnormal; Notable for the following components:      Result Value   Potassium 3.4 (*)    Glucose, Bld 115 (*)    Calcium 8.5 (*)    All other components within normal limits  CBC - Abnormal; Notable for the following components:   WBC 3.7 (*)    HCT 38.8 (*)    All other components within normal limits  RESP PANEL BY RT-PCR (RSV, FLU A&B, COVID)  RVPGX2  TROPONIN I (HIGH SENSITIVITY)  TROPONIN I (HIGH SENSITIVITY)     EKG  ED ECG REPORT I, Carlin Palin, the  attending physician, personally viewed and interpreted this ECG.   Date: 03/24/2024  EKG Time: 23:11  Rate: 106  Rhythm: sinus tachycardia  Axis: Normal  Intervals:none  ST&T Change: None  RADIOLOGY Chest x-ray reviewed and interpreted by me with no infiltrate, edema, or effusion.  PROCEDURES:  Critical Care performed: No  Procedures   MEDICATIONS ORDERED IN ED: Medications  lactated ringers bolus 1,000 mL (0 mLs Intravenous Stopped 03/24/24 0503)  iohexol (OMNIPAQUE) 350 MG/ML injection 100 mL (100 mLs Intravenous Contrast Given 03/24/24 0253)  acetaminophen  (TYLENOL ) tablet 1,000 mg (1,000 mg Oral Given 03/24/24 0328)  cefTRIAXone (ROCEPHIN) 2 g in sodium chloride 0.9 % 100 mL IVPB (0 g Intravenous Stopped 03/24/24 0351)  azithromycin (ZITHROMAX) 500 mg in sodium chloride 0.9 % 250 mL IVPB (500 mg Intravenous New Bag/Given 03/24/24 0354)     IMPRESSION / MDM / ASSESSMENT AND PLAN / ED COURSE  I reviewed  the triage vital signs and the nursing notes.                              33 y.o. male with past medical history of hypertension, bipolar disorder, and PTSD who presents to the ED complaining of sudden onset chest pain and shortness of breath this evening following 2 days of fevers, dizziness, lightheadedness, and bodyaches.  Patient's presentation is most consistent with acute presentation with potential threat to life or bodily function.  Differential diagnosis includes, but is not limited to, ACS, PE, pneumonia, pneumothorax, musculoskeletal pain, viral syndrome, COVID-19, influenza, anemia, electrolyte abnormality, AKI.  Patient uncomfortable but nontoxic-appearing and in no acute distress, he is tachycardic and mildly tachypneic, but maintaining oxygen saturations at 100% on room air.  EKG shows no evidence of arrhythmia or ischemia and initial troponin within normal limits, will check second set.  Chest x-ray is unremarkable, we will also check CTA of his chest to rule out PE and to check for occult pneumonia.  Additional labs without significant anemia, leukocytosis, electrolyte abnormality, or AKI.  Viral illness also considered with his fevers and bodyaches, will hydrate with IV fluids but patient declines pain or nausea medication.  CTA chest is negative for PE but does show evidence of right upper lobe pneumonia.  Patient noted to be febrile on recheck, was given IV Rocephin and azithromycin.  Fever and heart rate improving following Tylenol  and IV fluids, admission considered but patient appropriate for outpatient management given improvement in vital signs.  We will prescribe amoxicillin  and he was counseled to follow-up with his PCP, otherwise return to the ED for new or worsening symptoms.  Patient agrees with plan.      FINAL CLINICAL IMPRESSION(S) / ED DIAGNOSES   Final diagnoses:  Pneumonia of right upper lobe due to infectious organism     Rx / DC Orders   ED Discharge  Orders          Ordered    amoxicillin  (AMOXIL ) 500 MG capsule  3 times daily        03/24/24 0530             Note:  This document was prepared using Dragon voice recognition software and may include unintentional dictation errors.   Willo Dunnings, MD 03/24/24 [phone]

## 2024-04-10 NOTE — Progress Notes (Signed)
  Cardiology Office Note   Date:  04/20/2024  ID:  Samuel Zavala, DOB 1991-05-17, MRN [id number] PCP: Lorel Maxie LABOR, MD  [facility] Providers Cardiologist:  Caron Poser, MD     History of Present Illness Samuel Zavala is a 33 y.o. male PMH anxiety, bipolar disorder, PTSD on multiple psychiatric agents who presents for further evaluation management of near syncope.  Patient reports he has had several recent episodes of near syncope.  He denies any complete loss of consciousness.  He also reports intermittent palpitations and chest discomfort which sometimes correlate, sometimes not.  He does note a strong family history of ASCVD and CHF.  So far, this issue remains undifferentiated.  Relevant CVD History -None   ROS: Pt denies any chest discomfort, jaw pain, arm pain, palpitations, syncope, presyncope, orthopnea, PND, or LE edema.  Studies Reviewed I have independently reviewed the patient's ECG, recent medical records.  Physical Exam VS:  BP 120/78 (BP Location: Left Arm, Patient Position: Sitting, Cuff Size: Normal)   Pulse 71 Comment: 84 oximeter  Ht 5' 11 (1.803 m)   Wt 245 lb 12.8 oz (111.5 kg)   SpO2 98%   BMI 34.28 kg/m        Wt Readings from Last 3 Encounters:  04/20/24 245 lb 12.8 oz (111.5 kg)  03/23/24 243 lb (110.2 kg)  10/09/23 225 lb (102.1 kg)    GEN: No acute distress. NECK: No JVD; No carotid bruits. CARDIAC: RRR, no murmurs, rubs, gallops. RESPIRATORY:  Clear to auscultation. EXTREMITIES:  Warm and well-perfused. No edema.  ASSESSMENT AND PLAN Near syncope Paroxysmal tachycardia Chest discomfort Flushing Headaches Patient presents with near syncopal episodes that are undifferentiated.  They are sometimes accompanied by palpitations.  He has not had any frank syncope.  He also has intermittent chest discomfort.  His ECG is normal in office today.  His orthostatic vital signs are also normal, no signs of orthostatic hypotension or POTS.   He also notes intermittent paroxysms of flushing and headaches.  Plan: - Echocardiogram to evaluate for structural causes - Zio monitor to evaluate for arrhythmogenic causes - Discussed obtaining an ETT given his chest discomfort; he is overall very low pretest probability for obstructive CAD and would prefer to hold off for now. - Given his paroxysms of flushing and headaches, we will also obtain plasma metanephrines to rule out pheochromocytoma        Dispo: RTC as needed based on results of cardiac testing  Signed, Caron Poser, MD

## 2024-04-20 ENCOUNTER — Ambulatory Visit

## 2024-04-20 VITALS — BP 120/78 | HR 71 | Ht 71.0 in | Wt 245.8 lb

## 2024-04-20 DIAGNOSIS — R079 Chest pain, unspecified: Secondary | ICD-10-CM

## 2024-04-20 DIAGNOSIS — I479 Paroxysmal tachycardia, unspecified: Secondary | ICD-10-CM | POA: Diagnosis not present

## 2024-04-20 DIAGNOSIS — R55 Syncope and collapse: Secondary | ICD-10-CM | POA: Diagnosis not present

## 2024-04-20 DIAGNOSIS — R232 Flushing: Secondary | ICD-10-CM | POA: Diagnosis not present

## 2024-04-20 DIAGNOSIS — R519 Headache, unspecified: Secondary | ICD-10-CM

## 2024-04-20 DIAGNOSIS — G8929 Other chronic pain: Secondary | ICD-10-CM

## 2024-04-20 NOTE — Patient Instructions (Addendum)
 Medication Instructions:   Your physician recommends that you continue on your current medications as directed. Please refer to the Current Medication list given to you today.   *If you need a refill on your cardiac medications before your next appointment, please call your pharmacy*  Lab Work: Your provider would like for you to have following labs drawn today Plasma Metanephrine.  You need to lie down and rest for 20 minutes and have blood drawn while lying down.   If you have labs (blood work) drawn today and your tests are completely normal, you will receive your results only by: MyChart Message (if you have MyChart) OR A paper copy in the mail If you have any lab test that is abnormal or we need to change your treatment, we will call you to review the results.  Testing/Procedures: Your physician has requested that you have an echocardiogram. Echocardiography is a painless test that uses sound waves to create images of your heart. It provides your doctor with information about the size and shape of your heart and how well your heart's chambers and valves are working.   You may receive an ultrasound enhancing agent through an IV if needed to better visualize your heart during the echo. This procedure takes approximately one hour.  There are no restrictions for this procedure.  This will take place at [address] [facility] [address], [place] [address]  Please note: We ask at that you not bring children with you during ultrasound (echo/ vascular) testing. Due to room size and safety concerns, children are not allowed in the ultrasound rooms during exams. Our front office staff cannot provide observation of children in our lobby area while testing is being conducted. An adult accompanying a patient to their appointment will only be allowed in the ultrasound room at the discretion of the ultrasound technician under special circumstances. We apologize for any inconvenience.    ZIO XT- Long Term Monitor Instructions  Your physician has requested you wear a ZIO patch monitor for 14 days.  This is a single patch monitor. [employer] supplies one patch monitor per enrollment. Additional stickers are not available. Please do not apply patch if you will be having a Nuclear Stress Test, Echocardiogram, Cardiac CT, MRI, or Chest Xray during the period you would be wearing the monitor. The patch cannot be worn during these tests. You cannot remove and re-apply the ZIO XT patch monitor.  Your ZIO patch monitor will be mailed 3 day USPS to your address on file. It may take 3-5 days to receive your monitor after you have been enrolled. Once you have received your monitor, please review the enclosed instructions. Your monitor has already been registered assigning a specific monitor serial number to you.  Billing and Patient Assistance Program Information  We have supplied [employer] with any of your insurance information on file for billing purposes.  [employer] offers a sliding scale Patient Assistance Program for patients that do not have insurance, or whose insurance does not completely cover the cost of the ZIO monitor.  You must apply for the Patient Assistance Program to qualify for this discounted rate.  To apply, please call [employer] at [phone], select option 4, select option 2, ask to apply for Patient Assistance Program. Meredeth will ask your household income, and how many people are in your household. They will quote your out-of-pocket cost based on that information. [employer] will also be able to set up a 38-month, interest-free payment plan if needed.  Applying the monitor   Shave hair from upper left chest.  Hold abrader disc by orange tab. Rub abrader in 40 strokes over the upper left chest as indicated in your monitor instructions.  Clean area with 4 enclosed alcohol pads. Let dry.  Apply patch as indicated in monitor instructions. Patch will be placed under collarbone on  left side of chest with arrow pointing upward.  Rub patch adhesive wings for 2 minutes. Remove white label marked 1. Remove the white label marked 2. Rub patch adhesive wings for 2 additional minutes.  While looking in a mirror, press and release button in center of patch. A small green light will flash 3-4 times. This will be your only indicator that the monitor has been turned on.   After Applying Monitor: Do not shower for the first 24 hours. You may shower after the first 24 hours. (Keep back to water) Press the button if you feel a symptom. You will hear a small click. Record Date, Time and Symptom in the Patient Logbook.   After Completing 14 Days: When you are ready to remove the patch, follow instructions on the last 2 pages of Patient Logbook.  Stick patch monitor into the tabs at the bottom of the return box.  Place Patient Logbook in the blue and white box. Use locking tab on box and tape box closed securely. The blue and white box has prepaid postage on it. Please place it in the mailbox as soon as possible. Your physician should have your test results approximately 7-14 days after the monitor has been mailed back to [facility].   Troubleshooting: Call [facility] at [phone] if you have questions regarding your ZIO XT patch monitor.  Call them immediately if you see an orange light blinking on your monitor.  If your monitor falls off in less than 4 days, contact our Monitor department at [phone].  If your monitor becomes loose or falls off after 4 days call [employer] at [phone] for suggestions on securing your monitor.   Follow-Up: At [facility], you and your health needs are our priority.  As part of our continuing mission to provide you with exceptional heart care, our providers are all part of one team.  This team includes your primary Cardiologist (physician) and Advanced Practice Providers or APPs (Physician Assistants and Nurse  Practitioners) who all work together to provide you with the care you need, when you need it.  Your next appointment:   As needed   Provider:   Caron Poser, MD    We recommend signing up for the patient portal called MyChart.  Sign up information is provided on this After Visit Summary.  MyChart is used to connect with patients for Virtual Visits (Telemedicine).  Patients are able to view lab/test results, encounter notes, upcoming appointments, etc.  Non-urgent messages can be sent to your provider as well.   To learn more about what you can do with MyChart, go to [email or web address].

## 2024-04-28 ENCOUNTER — Ambulatory Visit: Payer: Self-pay

## 2024-04-28 LAB — METANEPHRINES, PLASMA
Metanephrine, Free: <25 pg/mL (ref 0.0–88.0)
Normetanephrine, Free: 120.9 pg/mL (ref 0.0–210.1)

## 2024-05-07 DIAGNOSIS — R55 Syncope and collapse: Secondary | ICD-10-CM | POA: Diagnosis not present

## 2024-05-07 DIAGNOSIS — I479 Paroxysmal tachycardia, unspecified: Secondary | ICD-10-CM | POA: Diagnosis not present

## 2024-05-09 ENCOUNTER — Other Ambulatory Visit: Payer: Self-pay

## 2024-05-09 ENCOUNTER — Emergency Department
Admission: EM | Admit: 2024-05-09 | Discharge: 2024-05-09 | Disposition: A | Discharge status: Home / Self Care | Attending: Emergency Medicine | Admitting: Emergency Medicine

## 2024-05-09 DIAGNOSIS — N3943 Post-void dribbling: Secondary | ICD-10-CM | POA: Diagnosis not present

## 2024-05-09 DIAGNOSIS — I10 Essential (primary) hypertension: Secondary | ICD-10-CM | POA: Diagnosis not present

## 2024-05-09 DIAGNOSIS — R369 Urethral discharge, unspecified: Secondary | ICD-10-CM | POA: Diagnosis present

## 2024-05-09 LAB — CBC
HCT: 42.1 % (ref 39.0–52.0)
Hemoglobin: 14.2 g/dL (ref 13.0–17.0)
MCH: 28.6 pg (ref 26.0–34.0)
MCHC: 33.7 g/dL (ref 30.0–36.0)
MCV: 84.9 fL (ref 80.0–100.0)
Platelets: 301 K/uL (ref 150–400)
RBC: 4.96 MIL/uL (ref 4.22–5.81)
RDW: 13 % (ref 11.5–15.5)
WBC: 7.1 K/uL (ref 4.0–10.5)
nRBC: 0 % (ref 0.0–0.2)

## 2024-05-09 LAB — BASIC METABOLIC PANEL WITH GFR
Anion gap: 11 (ref 5–15)
BUN: 8 mg/dL (ref 6–20)
CO2: 26 mmol/L (ref 22–32)
Calcium: 8.7 mg/dL — ABNORMAL LOW (ref 8.9–10.3)
Chloride: 103 mmol/L (ref 98–111)
Creatinine, Ser: 0.86 mg/dL (ref 0.61–1.24)
GFR, Estimated: >60 mL/min (ref >60)
Glucose, Bld: 91 mg/dL (ref 70–99)
Potassium: 3.6 mmol/L (ref 3.5–5.1)
Sodium: 140 mmol/L (ref 135–145)

## 2024-05-09 LAB — URINALYSIS, ROUTINE W REFLEX MICROSCOPIC
Bilirubin Urine: NEGATIVE
Glucose, UA: NEGATIVE mg/dL
Hgb urine dipstick: NEGATIVE
Ketones, ur: NEGATIVE mg/dL
Leukocytes,Ua: NEGATIVE
Nitrite: NEGATIVE
Protein, ur: NEGATIVE mg/dL
Specific Gravity, Urine: 1.01 (ref 1.005–1.030)
pH: 6 (ref 5.0–8.0)

## 2024-05-09 LAB — CHLAMYDIA/NGC RT PCR (ARMC ONLY)
Chlamydia Tr: NOT DETECTED
N gonorrhoeae: NOT DETECTED

## 2024-05-09 NOTE — ED Provider Notes (Signed)
 [facility] Emergency Department Provider Note     Event Date/Time   First MD Initiated Contact with Patient 05/09/24 2158     (approximate)   History   Penile Discharge   HPI  Samuel Zavala is a 33 y.o. male with a history of bipolar 1 disorder, PTSD, ADHD, anxiety, hypertension, presents to the ED for evaluation of what he described as an episode of clear slime from his penis this morning after he sat down for a bowel movement.  Patient denies any pain, irritation, ongoing discharge.  He denies any known fevers, chills, sweats.  He denies any frank hematuria, urinary frequency, or urinary retention.  He describes symptoms that have been persistent for several months to years, noting that he has to strain to force himself to pee.  He reports sometimes after he feels like he is emptying his bladder, he has dribbling at the urethra.  Patient was evaluated by urology about 6 months prior for similar symptoms, and reports a negative cystoscopy.  He was referred to PT for pelvic floor exercises and strengthening at that time.  He did not follow through as recommended.  He presents to the ED for evaluation of his acute complaint.  No fevers, chills, abdominal pain reported.  He denies any concern for STI.  Physical Exam   Triage Vital Signs: ED Triage Vitals  Encounter Vitals Group     BP 05/09/24 2128 (!) 138/100     Girls Systolic BP Percentile --      Girls Diastolic BP Percentile --      Boys Systolic BP Percentile --      Boys Diastolic BP Percentile --      Pulse Rate 05/09/24 2128 (!) 106     Resp 05/09/24 2128 18     Temp 05/09/24 2128 98.1 F (36.7 C)     Temp Source 05/09/24 2128 Oral     SpO2 05/09/24 2128 100 %     Weight 05/09/24 2129 246 lb 14.6 oz (112 kg)     Height 05/09/24 2129 5' 11 (1.803 m)     Head Circumference --      Peak Flow --      Pain Score 05/09/24 2129 0     Pain Loc --      Pain Education --      Exclude from Growth  Chart --     Most recent vital signs: Vitals:   05/09/24 2128 05/09/24 2200  BP: (!) 138/100   Pulse: (!) 106   Resp: 18   Temp: 98.1 F (36.7 C)   SpO2: 100% 100%    General Awake, no distress. NAD HEENT NCAT. PERRL. EOMI. No rhinorrhea. Mucous membranes are moist.  CV:  Good peripheral perfusion.  RESP:  Normal effort.  ABD:  No distention.  Soft and nontender. GU:  Normal external genitalia.  Patient with a single papule noted to the scrotal region, without surrounding duration likely representing a small folliculitis.   ED Results / Procedures / Treatments   Labs (all labs ordered are listed, but only abnormal results are displayed) Labs Reviewed  URINALYSIS, ROUTINE W REFLEX MICROSCOPIC - Abnormal; Notable for the following components:      Result Value   Color, Urine YELLOW (*)    APPearance CLEAR (*)    All other components within normal limits  BASIC METABOLIC PANEL WITH GFR - Abnormal; Notable for the following components:   Calcium 8.7 (*)  All other components within normal limits  CHLAMYDIA/NGC RT PCR ([facility] ONLY)            CBC     EKG   RADIOLOGY   PROCEDURES:  Critical Care performed: No  Procedures   MEDICATIONS ORDERED IN ED: Medications - No data to display   IMPRESSION / MDM / ASSESSMENT AND PLAN / ED COURSE  I reviewed the triage vital signs and the nursing notes.                              Differential diagnosis includes, but is not limited to, arthritis, urinary retention, prostatitis, STI  Patient's presentation is most consistent with acute complicated illness / injury requiring diagnostic workup.  Patient's diagnosis is consistent with urinary dribbling with reports of some mucoid penile discharge.  Patient's exam is overall reassuring.  No acute lab abnormalities are noted.  UA without evidence of hematuria or bacteriuria.  GC urine culture pending at this time.  Exam shows a small papule to the scrotal sac without  evidence of varicosity or hydrocele.  Patient patient with reassuring exam at this time, with no indication for any emergent imaging or further interventions.  Patient is to follow up with urology as discussed, as needed or otherwise directed. Patient is given ED precautions to return to the ED for any worsening or new symptoms.   FINAL CLINICAL IMPRESSION(S) / ED DIAGNOSES   Final diagnoses:  Urinary dribbling     Rx / DC Orders   ED Discharge Orders     None        Note:  This document was prepared using Dragon voice recognition software and may include unintentional dictation errors.    Loyd Candida LULLA Aldona, PA-C 05/09/24 2256    Claudene Rover, MD 05/09/24 [phone]

## 2024-05-09 NOTE — Discharge Instructions (Signed)
 Your exam, and labs are normal and reassuring at this time.  No signs of any serious underlying infection.  Follow-up with urology as discussed.

## 2024-05-09 NOTE — ED Triage Notes (Signed)
 Pt noted clear slime from his urethra today after having a bowel movement. No new sexual partners (last sexual experience was 3+ months ago; wore a condom). Denies known fevers, chills, burning with urination. Pt states it dribbles even after I am done peeing.   Past Medical History:  Diagnosis Date   ADHD (attention deficit hyperactivity disorder)    Anxiety    Bipolar 1 disorder (HCC)    Hypertension    PTSD (post-traumatic stress disorder) 03/25/2020   Thyroid disease 03/25/2020   Pt no longer on levothyroxine

## 2024-06-16 ENCOUNTER — Ambulatory Visit

## 2024-06-16 DIAGNOSIS — I479 Paroxysmal tachycardia, unspecified: Secondary | ICD-10-CM | POA: Diagnosis not present

## 2024-06-16 DIAGNOSIS — R079 Chest pain, unspecified: Secondary | ICD-10-CM

## 2024-06-16 DIAGNOSIS — R55 Syncope and collapse: Secondary | ICD-10-CM

## 2024-06-16 LAB — ECHOCARDIOGRAM COMPLETE
Area-P 1/2: 4.06 cm2
S' Lateral: 2.2 cm

## 2024-06-23 ENCOUNTER — Ambulatory Visit: Admitting: Urology

## 2024-07-14 ENCOUNTER — Ambulatory Visit: Admitting: Urology

## 2024-07-14 DIAGNOSIS — R350 Frequency of micturition: Secondary | ICD-10-CM

## 2024-08-25 ENCOUNTER — Ambulatory Visit: Admitting: Urology

## 2024-09-08 ENCOUNTER — Ambulatory Visit
